# Patient Record
Sex: Female | Born: 1970 | Race: Black or African American | Hispanic: No | Marital: Single | Smoking: Current every day smoker
Health system: Southern US, Community
[De-identification: ages and names within clinical notes are randomized; demographics above are authoritative.]

## PROBLEM LIST (undated history)

## (undated) DIAGNOSIS — I219 Acute myocardial infarction, unspecified: Secondary | ICD-10-CM

## (undated) DIAGNOSIS — Z6841 Body Mass Index (BMI) 40.0 and over, adult: Secondary | ICD-10-CM

## (undated) DIAGNOSIS — I509 Heart failure, unspecified: Secondary | ICD-10-CM

## (undated) DIAGNOSIS — I2510 Atherosclerotic heart disease of native coronary artery without angina pectoris: Secondary | ICD-10-CM

## (undated) DIAGNOSIS — I10 Essential (primary) hypertension: Secondary | ICD-10-CM

## (undated) DIAGNOSIS — F329 Major depressive disorder, single episode, unspecified: Secondary | ICD-10-CM

## (undated) DIAGNOSIS — F32A Depression, unspecified: Secondary | ICD-10-CM

## (undated) HISTORY — PX: TUBAL LIGATION: SHX77

## (undated) HISTORY — DX: Morbid (severe) obesity due to excess calories: E66.01

## (undated) HISTORY — DX: Body Mass Index (BMI) 40.0 and over, adult: Z684

---

## 1997-07-12 ENCOUNTER — Ambulatory Visit (HOSPITAL_COMMUNITY): Admission: RE | Admit: 1997-07-12 | Discharge: 1997-07-12 | Payer: Self-pay | Admitting: Obstetrics

## 1997-10-01 ENCOUNTER — Ambulatory Visit (HOSPITAL_COMMUNITY): Admission: RE | Admit: 1997-10-01 | Discharge: 1997-10-01 | Payer: Self-pay | Admitting: Obstetrics

## 1997-10-05 ENCOUNTER — Ambulatory Visit (HOSPITAL_COMMUNITY): Admission: RE | Admit: 1997-10-05 | Discharge: 1997-10-05 | Payer: Self-pay | Admitting: Obstetrics

## 1997-12-06 ENCOUNTER — Inpatient Hospital Stay (HOSPITAL_COMMUNITY): Admission: AD | Admit: 1997-12-06 | Discharge: 1997-12-09 | Payer: Self-pay | Admitting: Obstetrics

## 1998-07-31 ENCOUNTER — Emergency Department (HOSPITAL_COMMUNITY): Admission: EM | Admit: 1998-07-31 | Discharge: 1998-07-31 | Payer: Self-pay | Admitting: Emergency Medicine

## 1999-05-14 ENCOUNTER — Emergency Department (HOSPITAL_COMMUNITY): Admission: EM | Admit: 1999-05-14 | Discharge: 1999-05-14 | Payer: Self-pay | Admitting: Emergency Medicine

## 1999-05-27 ENCOUNTER — Other Ambulatory Visit: Admission: RE | Admit: 1999-05-27 | Discharge: 1999-05-27 | Payer: Self-pay | Admitting: Obstetrics

## 1999-06-05 ENCOUNTER — Encounter: Payer: Self-pay | Admitting: Obstetrics

## 1999-06-05 ENCOUNTER — Ambulatory Visit (HOSPITAL_COMMUNITY): Admission: RE | Admit: 1999-06-05 | Discharge: 1999-06-05 | Payer: Self-pay | Admitting: Obstetrics

## 2000-08-18 ENCOUNTER — Emergency Department (HOSPITAL_COMMUNITY): Admission: EM | Admit: 2000-08-18 | Discharge: 2000-08-18 | Payer: Self-pay | Admitting: Internal Medicine

## 2000-10-03 ENCOUNTER — Emergency Department (HOSPITAL_COMMUNITY): Admission: EM | Admit: 2000-10-03 | Discharge: 2000-10-03 | Payer: Self-pay | Admitting: Emergency Medicine

## 2000-10-29 ENCOUNTER — Emergency Department (HOSPITAL_COMMUNITY): Admission: EM | Admit: 2000-10-29 | Discharge: 2000-10-29 | Payer: Self-pay | Admitting: Emergency Medicine

## 2001-03-25 ENCOUNTER — Emergency Department (HOSPITAL_COMMUNITY): Admission: EM | Admit: 2001-03-25 | Discharge: 2001-03-25 | Payer: Self-pay | Admitting: Emergency Medicine

## 2001-08-08 ENCOUNTER — Emergency Department (HOSPITAL_COMMUNITY): Admission: EM | Admit: 2001-08-08 | Discharge: 2001-08-08 | Payer: Self-pay | Admitting: *Deleted

## 2001-08-13 ENCOUNTER — Emergency Department (HOSPITAL_COMMUNITY): Admission: EM | Admit: 2001-08-13 | Discharge: 2001-08-13 | Payer: Self-pay | Admitting: Emergency Medicine

## 2001-08-13 ENCOUNTER — Encounter: Payer: Self-pay | Admitting: Emergency Medicine

## 2002-05-22 ENCOUNTER — Encounter: Payer: Self-pay | Admitting: Emergency Medicine

## 2002-05-22 ENCOUNTER — Emergency Department (HOSPITAL_COMMUNITY): Admission: EM | Admit: 2002-05-22 | Discharge: 2002-05-22 | Payer: Self-pay | Admitting: Emergency Medicine

## 2002-05-23 ENCOUNTER — Encounter: Admission: RE | Admit: 2002-05-23 | Discharge: 2002-08-21 | Payer: Self-pay | Admitting: Sports Medicine

## 2002-06-14 ENCOUNTER — Encounter: Payer: Self-pay | Admitting: Emergency Medicine

## 2002-06-15 ENCOUNTER — Inpatient Hospital Stay (HOSPITAL_COMMUNITY): Admission: EM | Admit: 2002-06-15 | Discharge: 2002-06-17 | Payer: Self-pay | Admitting: Emergency Medicine

## 2002-06-17 ENCOUNTER — Encounter: Payer: Self-pay | Admitting: *Deleted

## 2003-08-02 ENCOUNTER — Emergency Department (HOSPITAL_COMMUNITY): Admission: EM | Admit: 2003-08-02 | Discharge: 2003-08-02 | Payer: Self-pay | Admitting: Emergency Medicine

## 2004-09-26 ENCOUNTER — Emergency Department (HOSPITAL_COMMUNITY): Admission: EM | Admit: 2004-09-26 | Discharge: 2004-09-26 | Payer: Self-pay | Admitting: Emergency Medicine

## 2005-01-06 ENCOUNTER — Emergency Department (HOSPITAL_COMMUNITY): Admission: EM | Admit: 2005-01-06 | Discharge: 2005-01-06 | Payer: Self-pay | Admitting: Emergency Medicine

## 2005-01-21 ENCOUNTER — Emergency Department (HOSPITAL_COMMUNITY): Admission: EM | Admit: 2005-01-21 | Discharge: 2005-01-21 | Payer: Self-pay | Admitting: Emergency Medicine

## 2005-11-23 ENCOUNTER — Emergency Department (HOSPITAL_COMMUNITY): Admission: EM | Admit: 2005-11-23 | Discharge: 2005-11-23 | Payer: Self-pay | Admitting: Emergency Medicine

## 2005-12-18 ENCOUNTER — Emergency Department (HOSPITAL_COMMUNITY): Admission: EM | Admit: 2005-12-18 | Discharge: 2005-12-18 | Payer: Self-pay | Admitting: Family Medicine

## 2006-03-14 ENCOUNTER — Emergency Department (HOSPITAL_COMMUNITY): Admission: EM | Admit: 2006-03-14 | Discharge: 2006-03-15 | Payer: Self-pay | Admitting: Emergency Medicine

## 2006-06-20 ENCOUNTER — Inpatient Hospital Stay (HOSPITAL_COMMUNITY): Admission: EM | Admit: 2006-06-20 | Discharge: 2006-06-21 | Payer: Self-pay | Admitting: Emergency Medicine

## 2006-10-20 ENCOUNTER — Emergency Department (HOSPITAL_COMMUNITY): Admission: EM | Admit: 2006-10-20 | Discharge: 2006-10-20 | Payer: Self-pay | Admitting: Emergency Medicine

## 2006-10-22 ENCOUNTER — Encounter: Admission: RE | Admit: 2006-10-22 | Discharge: 2006-10-22 | Payer: Self-pay | Admitting: General Surgery

## 2006-11-11 ENCOUNTER — Encounter: Admission: RE | Admit: 2006-11-11 | Discharge: 2006-11-11 | Payer: Self-pay | Admitting: General Surgery

## 2006-11-16 ENCOUNTER — Ambulatory Visit (HOSPITAL_COMMUNITY): Admission: RE | Admit: 2006-11-16 | Discharge: 2006-11-16 | Payer: Self-pay | Admitting: General Surgery

## 2006-11-25 ENCOUNTER — Emergency Department (HOSPITAL_COMMUNITY): Admission: EM | Admit: 2006-11-25 | Discharge: 2006-11-25 | Payer: Self-pay | Admitting: Family Medicine

## 2007-05-06 ENCOUNTER — Emergency Department (HOSPITAL_COMMUNITY): Admission: EM | Admit: 2007-05-06 | Discharge: 2007-05-06 | Payer: Self-pay | Admitting: Emergency Medicine

## 2007-05-24 ENCOUNTER — Emergency Department (HOSPITAL_COMMUNITY): Admission: EM | Admit: 2007-05-24 | Discharge: 2007-05-24 | Payer: Self-pay | Admitting: Emergency Medicine

## 2007-06-08 ENCOUNTER — Inpatient Hospital Stay (HOSPITAL_COMMUNITY): Admission: AD | Admit: 2007-06-08 | Discharge: 2007-06-10 | Payer: Self-pay | Admitting: Cardiovascular Disease

## 2007-06-09 ENCOUNTER — Encounter (INDEPENDENT_AMBULATORY_CARE_PROVIDER_SITE_OTHER): Payer: Self-pay | Admitting: Cardiovascular Disease

## 2007-08-29 ENCOUNTER — Emergency Department (HOSPITAL_COMMUNITY): Admission: EM | Admit: 2007-08-29 | Discharge: 2007-08-29 | Payer: Self-pay | Admitting: Emergency Medicine

## 2007-11-01 ENCOUNTER — Emergency Department (HOSPITAL_COMMUNITY): Admission: EM | Admit: 2007-11-01 | Discharge: 2007-11-01 | Payer: Self-pay | Admitting: Emergency Medicine

## 2007-12-25 ENCOUNTER — Inpatient Hospital Stay (HOSPITAL_COMMUNITY): Admission: EM | Admit: 2007-12-25 | Discharge: 2007-12-26 | Payer: Self-pay | Admitting: Emergency Medicine

## 2007-12-26 ENCOUNTER — Encounter (INDEPENDENT_AMBULATORY_CARE_PROVIDER_SITE_OTHER): Payer: Self-pay | Admitting: Cardiovascular Disease

## 2008-05-24 ENCOUNTER — Encounter: Admission: RE | Admit: 2008-05-24 | Discharge: 2008-05-24 | Payer: Self-pay | Admitting: Nephrology

## 2008-06-13 ENCOUNTER — Encounter (HOSPITAL_COMMUNITY): Admission: RE | Admit: 2008-06-13 | Discharge: 2008-09-11 | Payer: Self-pay | Admitting: Cardiology

## 2008-06-13 ENCOUNTER — Emergency Department (HOSPITAL_COMMUNITY): Admission: EM | Admit: 2008-06-13 | Discharge: 2008-06-13 | Payer: Self-pay | Admitting: Emergency Medicine

## 2008-08-02 ENCOUNTER — Emergency Department (HOSPITAL_COMMUNITY): Admission: EM | Admit: 2008-08-02 | Discharge: 2008-08-02 | Payer: Self-pay | Admitting: Emergency Medicine

## 2008-09-04 ENCOUNTER — Emergency Department (HOSPITAL_COMMUNITY): Admission: EM | Admit: 2008-09-04 | Discharge: 2008-09-04 | Payer: Self-pay | Admitting: Emergency Medicine

## 2008-09-15 ENCOUNTER — Inpatient Hospital Stay (HOSPITAL_COMMUNITY): Admission: EM | Admit: 2008-09-15 | Discharge: 2008-09-18 | Payer: Self-pay | Admitting: Emergency Medicine

## 2008-10-16 ENCOUNTER — Ambulatory Visit (HOSPITAL_BASED_OUTPATIENT_CLINIC_OR_DEPARTMENT_OTHER): Admission: RE | Admit: 2008-10-16 | Discharge: 2008-10-16 | Payer: Self-pay | Admitting: Nephrology

## 2008-10-20 ENCOUNTER — Ambulatory Visit: Payer: Self-pay | Admitting: Internal Medicine

## 2008-11-30 ENCOUNTER — Emergency Department (HOSPITAL_COMMUNITY): Admission: EM | Admit: 2008-11-30 | Discharge: 2008-11-30 | Payer: Self-pay | Admitting: Emergency Medicine

## 2009-01-28 ENCOUNTER — Encounter: Payer: Self-pay | Admitting: Internal Medicine

## 2009-02-20 ENCOUNTER — Encounter: Admission: RE | Admit: 2009-02-20 | Discharge: 2009-02-20 | Payer: Self-pay | Admitting: Obstetrics

## 2009-02-26 ENCOUNTER — Ambulatory Visit (HOSPITAL_COMMUNITY): Admission: RE | Admit: 2009-02-26 | Discharge: 2009-02-26 | Payer: Self-pay | Admitting: Obstetrics

## 2009-05-26 ENCOUNTER — Inpatient Hospital Stay (HOSPITAL_COMMUNITY): Admission: EM | Admit: 2009-05-26 | Discharge: 2009-05-30 | Payer: Self-pay | Admitting: Emergency Medicine

## 2009-05-28 ENCOUNTER — Encounter (INDEPENDENT_AMBULATORY_CARE_PROVIDER_SITE_OTHER): Payer: Self-pay | Admitting: Cardiovascular Disease

## 2009-08-03 ENCOUNTER — Emergency Department (HOSPITAL_COMMUNITY): Admission: EM | Admit: 2009-08-03 | Discharge: 2009-08-03 | Payer: Self-pay | Admitting: Family Medicine

## 2009-09-03 ENCOUNTER — Emergency Department (HOSPITAL_COMMUNITY): Admission: EM | Admit: 2009-09-03 | Discharge: 2009-09-03 | Payer: Self-pay | Admitting: Emergency Medicine

## 2009-09-18 ENCOUNTER — Emergency Department (HOSPITAL_COMMUNITY): Admission: EM | Admit: 2009-09-18 | Discharge: 2009-09-19 | Payer: Self-pay | Admitting: Emergency Medicine

## 2009-11-24 ENCOUNTER — Emergency Department (HOSPITAL_COMMUNITY): Admission: EM | Admit: 2009-11-24 | Discharge: 2009-11-24 | Payer: Self-pay | Admitting: Emergency Medicine

## 2010-02-13 ENCOUNTER — Emergency Department (HOSPITAL_COMMUNITY): Admission: EM | Admit: 2010-02-13 | Discharge: 2009-12-04 | Payer: Self-pay | Admitting: Emergency Medicine

## 2010-02-27 ENCOUNTER — Emergency Department (HOSPITAL_COMMUNITY)
Admission: EM | Admit: 2010-02-27 | Discharge: 2010-02-28 | Payer: Self-pay | Source: Home / Self Care | Admitting: Emergency Medicine

## 2010-03-12 ENCOUNTER — Emergency Department (HOSPITAL_COMMUNITY)
Admission: EM | Admit: 2010-03-12 | Discharge: 2010-03-12 | Payer: Self-pay | Source: Home / Self Care | Admitting: Family Medicine

## 2010-03-30 ENCOUNTER — Encounter: Payer: Self-pay | Admitting: Emergency Medicine

## 2010-03-30 ENCOUNTER — Encounter (HOSPITAL_BASED_OUTPATIENT_CLINIC_OR_DEPARTMENT_OTHER): Payer: Self-pay | Admitting: General Surgery

## 2010-03-30 ENCOUNTER — Encounter: Payer: Self-pay | Admitting: General Surgery

## 2010-03-30 ENCOUNTER — Encounter: Payer: Self-pay | Admitting: Cardiology

## 2010-03-30 ENCOUNTER — Encounter: Payer: Self-pay | Admitting: Obstetrics

## 2010-03-31 ENCOUNTER — Encounter: Payer: Self-pay | Admitting: Cardiology

## 2010-05-19 LAB — RAPID STREP SCREEN (MED CTR MEBANE ONLY): Streptococcus, Group A Screen (Direct): NEGATIVE

## 2010-05-22 LAB — RAPID STREP SCREEN (MED CTR MEBANE ONLY): Streptococcus, Group A Screen (Direct): NEGATIVE

## 2010-05-22 LAB — CBC
Platelets: 362 10*3/uL (ref 150–400)
RDW: 17.4 % — ABNORMAL HIGH (ref 11.5–15.5)
WBC: 10 10*3/uL (ref 4.0–10.5)

## 2010-05-22 LAB — DIFFERENTIAL
Basophils Absolute: 0.1 10*3/uL (ref 0.0–0.1)
Lymphocytes Relative: 33 % (ref 12–46)
Neutro Abs: 5.9 10*3/uL (ref 1.7–7.7)
Neutrophils Relative %: 59 % (ref 43–77)

## 2010-05-22 LAB — MONONUCLEOSIS SCREEN: Mono Screen: NEGATIVE

## 2010-05-22 LAB — POCT I-STAT, CHEM 8
Calcium, Ion: 1.18 mmol/L (ref 1.12–1.32)
Chloride: 110 mEq/L (ref 96–112)
HCT: 37 % (ref 36.0–46.0)
Potassium: 3.2 mEq/L — ABNORMAL LOW (ref 3.5–5.1)

## 2010-05-25 LAB — DIFFERENTIAL
Eosinophils Absolute: 0.1 10*3/uL (ref 0.0–0.7)
Eosinophils Relative: 1 % (ref 0–5)
Lymphocytes Relative: 14 % (ref 12–46)
Lymphs Abs: 1.6 10*3/uL (ref 0.7–4.0)
Monocytes Relative: 3 % (ref 3–12)
Neutro Abs: 9.4 10*3/uL — ABNORMAL HIGH (ref 1.7–7.7)
Neutrophils Relative %: 82 % — ABNORMAL HIGH (ref 43–77)

## 2010-05-25 LAB — CBC
HCT: 34.8 % — ABNORMAL LOW (ref 36.0–46.0)
Hemoglobin: 11.5 g/dL — ABNORMAL LOW (ref 12.0–15.0)
MCHC: 33.1 g/dL (ref 30.0–36.0)

## 2010-05-25 LAB — POCT CARDIAC MARKERS
CKMB, poc: 2.6 ng/mL (ref 1.0–8.0)
Troponin i, poc: 0.08 ng/mL (ref 0.00–0.09)

## 2010-05-25 LAB — ETHANOL: Alcohol, Ethyl (B): <5 mg/dL (ref 0–10)

## 2010-05-25 LAB — COMPREHENSIVE METABOLIC PANEL
Alkaline Phosphatase: 97 U/L (ref 39–117)
BUN: 12 mg/dL (ref 6–23)
Chloride: 106 mEq/L (ref 96–112)
Glucose, Bld: 102 mg/dL — ABNORMAL HIGH (ref 70–99)
Potassium: 3.3 mEq/L — ABNORMAL LOW (ref 3.5–5.1)
Total Bilirubin: 0.5 mg/dL (ref 0.3–1.2)

## 2010-05-25 LAB — BRAIN NATRIURETIC PEPTIDE: Pro B Natriuretic peptide (BNP): 541 pg/mL — ABNORMAL HIGH (ref 0.0–100.0)

## 2010-05-30 ENCOUNTER — Emergency Department (HOSPITAL_COMMUNITY): Payer: Medicaid Other

## 2010-05-30 ENCOUNTER — Observation Stay (HOSPITAL_COMMUNITY)
Admission: EM | Admit: 2010-05-30 | Discharge: 2010-06-02 | Disposition: A | Discharge status: Home / Self Care | Payer: Medicaid Other | Source: Ambulatory Visit | Attending: Cardiovascular Disease | Admitting: Cardiovascular Disease

## 2010-05-30 ENCOUNTER — Observation Stay (HOSPITAL_COMMUNITY): Payer: Medicaid Other

## 2010-05-30 ENCOUNTER — Other Ambulatory Visit: Payer: Self-pay | Admitting: Cardiovascular Disease

## 2010-05-30 DIAGNOSIS — R079 Chest pain, unspecified: Principal | ICD-10-CM | POA: Insufficient documentation

## 2010-05-30 DIAGNOSIS — D509 Iron deficiency anemia, unspecified: Secondary | ICD-10-CM | POA: Insufficient documentation

## 2010-05-30 DIAGNOSIS — Z7982 Long term (current) use of aspirin: Secondary | ICD-10-CM | POA: Insufficient documentation

## 2010-05-30 DIAGNOSIS — R42 Dizziness and giddiness: Secondary | ICD-10-CM | POA: Insufficient documentation

## 2010-05-30 DIAGNOSIS — Z79899 Other long term (current) drug therapy: Secondary | ICD-10-CM | POA: Insufficient documentation

## 2010-05-30 DIAGNOSIS — I252 Old myocardial infarction: Secondary | ICD-10-CM | POA: Insufficient documentation

## 2010-05-30 DIAGNOSIS — K219 Gastro-esophageal reflux disease without esophagitis: Secondary | ICD-10-CM | POA: Insufficient documentation

## 2010-05-30 DIAGNOSIS — R0602 Shortness of breath: Secondary | ICD-10-CM | POA: Insufficient documentation

## 2010-05-30 DIAGNOSIS — I10 Essential (primary) hypertension: Secondary | ICD-10-CM | POA: Insufficient documentation

## 2010-05-30 DIAGNOSIS — F17200 Nicotine dependence, unspecified, uncomplicated: Secondary | ICD-10-CM | POA: Insufficient documentation

## 2010-05-30 DIAGNOSIS — J449 Chronic obstructive pulmonary disease, unspecified: Secondary | ICD-10-CM | POA: Insufficient documentation

## 2010-05-30 DIAGNOSIS — E785 Hyperlipidemia, unspecified: Secondary | ICD-10-CM | POA: Insufficient documentation

## 2010-05-30 DIAGNOSIS — J4489 Other specified chronic obstructive pulmonary disease: Secondary | ICD-10-CM | POA: Insufficient documentation

## 2010-05-30 DIAGNOSIS — I422 Other hypertrophic cardiomyopathy: Secondary | ICD-10-CM | POA: Insufficient documentation

## 2010-05-30 LAB — CBC
HCT: 31.8 % — ABNORMAL LOW (ref 36.0–46.0)
MCH: 26.7 pg (ref 26.0–34.0)
MCV: 80.9 fL (ref 78.0–100.0)
RBC: 3.93 MIL/uL (ref 3.87–5.11)
WBC: 7.4 10*3/uL (ref 4.0–10.5)

## 2010-05-30 LAB — DIFFERENTIAL
Eosinophils Absolute: 0.1 10*3/uL (ref 0.0–0.7)
Eosinophils Relative: 2 % (ref 0–5)
Lymphocytes Relative: 40 % (ref 12–46)
Lymphs Abs: 3 10*3/uL (ref 0.7–4.0)
Monocytes Relative: 7 % (ref 3–12)
Neutrophils Relative %: 52 % (ref 43–77)

## 2010-05-30 LAB — BASIC METABOLIC PANEL
BUN: 11 mg/dL (ref 6–23)
CO2: 24 mEq/L (ref 19–32)
Calcium: 8.9 mg/dL (ref 8.4–10.5)
Chloride: 106 mEq/L (ref 96–112)
Creatinine, Ser: 1.4 mg/dL — ABNORMAL HIGH (ref 0.4–1.2)
Creatinine, Ser: 1.04 mg/dL (ref 0.4–1.2)
GFR calc Af Amer: >60 mL/min (ref >60)
GFR calc non Af Amer: 58 mL/min — ABNORMAL LOW (ref >60)
Glucose, Bld: 135 mg/dL — ABNORMAL HIGH (ref 70–99)
Potassium: 2.8 mEq/L — ABNORMAL LOW (ref 3.5–5.1)

## 2010-05-30 LAB — PROTIME-INR
INR: 1.08 (ref 0.00–1.49)
Prothrombin Time: 14.2 seconds (ref 11.6–15.2)

## 2010-05-30 LAB — URINALYSIS, ROUTINE W REFLEX MICROSCOPIC
Glucose, UA: NEGATIVE mg/dL
Nitrite: NEGATIVE
Specific Gravity, Urine: 1.019 (ref 1.005–1.030)
pH: 6.5 (ref 5.0–8.0)

## 2010-05-30 LAB — URINE MICROSCOPIC-ADD ON

## 2010-05-30 LAB — TROPONIN I: Troponin I: 0.25 ng/mL — ABNORMAL HIGH (ref 0.00–0.06)

## 2010-05-30 LAB — CARDIAC PANEL(CRET KIN+CKTOT+MB+TROPI)
Relative Index: 2.1 (ref 0.0–2.5)
Total CK: 214 U/L — ABNORMAL HIGH (ref 7–177)
Troponin I: 0.21 ng/mL — ABNORMAL HIGH (ref 0.00–0.06)
Troponin I: 0.2 ng/mL — ABNORMAL HIGH (ref 0.00–0.06)

## 2010-05-30 LAB — RAPID URINE DRUG SCREEN, HOSP PERFORMED
Cocaine: NOT DETECTED
Tetrahydrocannabinol: POSITIVE — AB

## 2010-05-30 LAB — GLUCOSE, CAPILLARY: Glucose-Capillary: 97 mg/dL (ref 70–99)

## 2010-05-30 LAB — APTT: aPTT: 32 seconds (ref 24–37)

## 2010-05-31 ENCOUNTER — Other Ambulatory Visit (HOSPITAL_COMMUNITY): Payer: Medicaid Other

## 2010-05-31 ENCOUNTER — Observation Stay (HOSPITAL_COMMUNITY): Payer: Medicaid Other

## 2010-05-31 LAB — URINE CULTURE
Culture  Setup Time: 201203230924
Culture: NO GROWTH

## 2010-05-31 LAB — CARDIAC PANEL(CRET KIN+CKTOT+MB+TROPI)
CK, MB: 3.4 ng/mL (ref 0.3–4.0)
Relative Index: 1.5 (ref 0.0–2.5)
Total CK: 189 U/L — ABNORMAL HIGH (ref 7–177)
Troponin I: 0.17 ng/mL — ABNORMAL HIGH (ref 0.00–0.06)

## 2010-05-31 LAB — CBC
HCT: 30.5 % — ABNORMAL LOW (ref 36.0–46.0)
Hemoglobin: 9.9 g/dL — ABNORMAL LOW (ref 12.0–15.0)
WBC: 7.2 10*3/uL (ref 4.0–10.5)

## 2010-05-31 LAB — SEDIMENTATION RATE: Sed Rate: 23 mm/hr — ABNORMAL HIGH (ref 0–22)

## 2010-05-31 LAB — HEPARIN LEVEL (UNFRACTIONATED): Heparin Unfractionated: 0.18 IU/mL — ABNORMAL LOW (ref 0.30–0.70)

## 2010-05-31 MED ORDER — TECHNETIUM TC 99M TETROFOSMIN IV KIT
30.0000 | PACK | Freq: Once | INTRAVENOUS | Status: AC | PRN
  Administered 2010-05-31: 30 via INTRAVENOUS

## 2010-05-31 MED ORDER — TECHNETIUM TC 99M TETROFOSMIN IV KIT
30.0000 | PACK | Freq: Once | INTRAVENOUS | Status: AC | PRN
  Administered 2010-05-30: 30 via INTRAVENOUS

## 2010-05-31 MED ORDER — TECHNETIUM TC 99M TETROFOSMIN IV KIT: 30.0000 | PACK | Freq: Once | INTRAVENOUS | Status: AC | PRN

## 2010-06-01 ENCOUNTER — Other Ambulatory Visit (HOSPITAL_COMMUNITY): Payer: Medicaid Other

## 2010-06-01 LAB — CBC
HCT: 31.1 % — ABNORMAL LOW (ref 36.0–46.0)
Hemoglobin: 10.1 g/dL — ABNORMAL LOW (ref 12.0–15.0)
MCHC: 32.5 g/dL (ref 30.0–36.0)
RBC: 3.73 MIL/uL — ABNORMAL LOW (ref 3.87–5.11)
WBC: 7.3 10*3/uL (ref 4.0–10.5)

## 2010-06-01 LAB — CARDIAC PANEL(CRET KIN+CKTOT+MB+TROPI)
CK, MB: 2.8 ng/mL (ref 0.3–4.0)
Relative Index: 1.6 (ref 0.0–2.5)
Total CK: 172 U/L (ref 7–177)
Troponin I: 0.14 ng/mL — ABNORMAL HIGH (ref 0.00–0.06)

## 2010-06-01 LAB — HEPARIN LEVEL (UNFRACTIONATED): Heparin Unfractionated: 0.73 IU/mL — ABNORMAL HIGH (ref 0.30–0.70)

## 2010-06-02 LAB — CBC
HCT: 30.8 % — ABNORMAL LOW (ref 36.0–46.0)
HCT: 29.5 % — ABNORMAL LOW (ref 36.0–46.0)
HCT: 29.9 % — ABNORMAL LOW (ref 36.0–46.0)
Hemoglobin: 10.1 g/dL — ABNORMAL LOW (ref 12.0–15.0)
Hemoglobin: 9.4 g/dL — ABNORMAL LOW (ref 12.0–15.0)
Hemoglobin: 9.7 g/dL — ABNORMAL LOW (ref 12.0–15.0)
MCHC: 31.5 g/dL (ref 30.0–36.0)
MCHC: 33.1 g/dL (ref 30.0–36.0)
MCV: 80.9 fL (ref 78.0–100.0)
Platelets: 251 10*3/uL (ref 150–400)
Platelets: 319 10*3/uL (ref 150–400)
Platelets: 320 10*3/uL (ref 150–400)
Platelets: 322 10*3/uL (ref 150–400)
RBC: 3.64 MIL/uL — ABNORMAL LOW (ref 3.87–5.11)
RBC: 3.7 MIL/uL — ABNORMAL LOW (ref 3.87–5.11)
RBC: 3.82 MIL/uL — ABNORMAL LOW (ref 3.87–5.11)
RDW: 16.5 % — ABNORMAL HIGH (ref 11.5–15.5)
RDW: 17.5 % — ABNORMAL HIGH (ref 11.5–15.5)
WBC: 8.6 10*3/uL (ref 4.0–10.5)
WBC: 10.5 10*3/uL (ref 4.0–10.5)
WBC: 13.1 10*3/uL — ABNORMAL HIGH (ref 4.0–10.5)
WBC: 19.8 10*3/uL — ABNORMAL HIGH (ref 4.0–10.5)

## 2010-06-02 LAB — COMPREHENSIVE METABOLIC PANEL
AST: 21 U/L (ref 0–37)
Albumin: 3.2 g/dL — ABNORMAL LOW (ref 3.5–5.2)
BUN: 9 mg/dL (ref 6–23)
Calcium: 8.5 mg/dL (ref 8.4–10.5)
Chloride: 112 mEq/L (ref 96–112)
Creatinine, Ser: 0.74 mg/dL (ref 0.4–1.2)
GFR calc Af Amer: >60 mL/min (ref >60)
Total Bilirubin: 0.3 mg/dL (ref 0.3–1.2)

## 2010-06-02 LAB — CARDIAC PANEL(CRET KIN+CKTOT+MB+TROPI)
CK, MB: 4.8 ng/mL — ABNORMAL HIGH (ref 0.3–4.0)
Relative Index: 3.4 — ABNORMAL HIGH (ref 0.0–2.5)
Total CK: 183 U/L — ABNORMAL HIGH (ref 7–177)
Total CK: 192 U/L — ABNORMAL HIGH (ref 7–177)
Troponin I: 0.14 ng/mL — ABNORMAL HIGH (ref 0.00–0.06)

## 2010-06-02 LAB — LIPID PANEL
Cholesterol: 146 mg/dL (ref 0–200)
LDL Cholesterol: 49 mg/dL (ref 0–99)
Triglycerides: 34 mg/dL (ref <150)
VLDL: 32 mg/dL (ref 0–40)

## 2010-06-02 LAB — BASIC METABOLIC PANEL
BUN: 9 mg/dL (ref 6–23)
Chloride: 107 mEq/L (ref 96–112)
GFR calc non Af Amer: >60 mL/min (ref >60)
Potassium: 3.5 mEq/L (ref 3.5–5.1)
Potassium: 4.2 mEq/L (ref 3.5–5.1)
Sodium: 139 mEq/L (ref 135–145)

## 2010-06-02 LAB — TROPONIN I: Troponin I: 0.17 ng/mL — ABNORMAL HIGH (ref 0.00–0.06)

## 2010-06-02 LAB — IRON AND TIBC
Iron: 31 ug/dL — ABNORMAL LOW (ref 42–135)
TIBC: 331 ug/dL (ref 250–470)
UIBC: 300 ug/dL

## 2010-06-02 LAB — HEPARIN LEVEL (UNFRACTIONATED)
Heparin Unfractionated: 0.43 IU/mL (ref 0.30–0.70)
Heparin Unfractionated: 0.44 IU/mL (ref 0.30–0.70)
Heparin Unfractionated: 0.54 IU/mL (ref 0.30–0.70)
Heparin Unfractionated: 0.55 IU/mL (ref 0.30–0.70)

## 2010-06-02 LAB — TSH: TSH: 0.246 u[IU]/mL — ABNORMAL LOW (ref 0.350–4.500)

## 2010-06-02 LAB — FERRITIN: Ferritin: 21 ng/mL (ref 10–291)

## 2010-06-02 LAB — CK TOTAL AND CKMB (NOT AT ARMC)
CK, MB: 6.4 ng/mL (ref 0.3–4.0)
Relative Index: 2.9 — ABNORMAL HIGH (ref 0.0–2.5)

## 2010-06-02 LAB — BRAIN NATRIURETIC PEPTIDE: Pro B Natriuretic peptide (BNP): 368 pg/mL — ABNORMAL HIGH (ref 0.0–100.0)

## 2010-06-02 LAB — POCT CARDIAC MARKERS: Myoglobin, poc: 76.9 ng/mL (ref 12–200)

## 2010-06-13 NOTE — Discharge Summary (Signed)
Jamie, Farrell NO.:  [id number]  MEDICAL RECORD NO.:  [id number]           PATIENT TYPE:  I  LOCATION:  2029                         FACILITY:  [facility]  PHYSICIAN:  Ricki Rodriguez, M.D.  DATE OF BIRTH:  October 23, 1970  DATE OF ADMISSION:  05/30/2010 DATE OF DISCHARGE:  06/02/2010                              DISCHARGE SUMMARY   REFERRING PHYSICIAN:  Osvaldo Shipper. Spruill, MD  FINAL DIAGNOSES: 1. Chest pain. 2. Dizziness. 3. Hypertrophic cardiomyopathy. 4. Hypertension. 5. Obesity. 6. Hyperlipidemia. 7. Tobacco use disorder. 8. Asthma.  DISCHARGE MEDICATIONS: 1. Cymbalta 60 mg one tablet p.o. in the evening. 2. Seroquel 200 mg one tablet in the evening. 3. Remeron 30 mg one tablet daily. 4. Percocet 5/325 mg one tablet twice daily as needed. 5. Nitroglycerin 0.4 mg tablet once sublingual every 5 minutes x3 as     needed for chest pain. 6. Mucinex 600 mg one tablet every 12 hours as needed. 7. Aspirin 81 mg one daily. 8. Amlodipine 10 mg one daily.  DISCHARGE DIET:  Low-sodium heart-healthy diet.  DISCHARGE ACTIVITY: 1. The patient is to increase activity slowly as tolerated. 2. Follow up by Dr. Donia Guiles in 2 weeks.  HISTORY:  This 40 year old African American female with COPD, asthma, CAD status post MI in 2000 had substernal chest pain radiating to the jaw with a near syncope.  The patient was chest pain free after arrival to the emergency room.  PHYSICAL EXAMINATION:  VITAL SIGNS:  Temperature 97, pulse 77, respirations 20, blood pressure 105/41. GENERAL:  The patient is well-built, well-nourished female, in no acute distress. HEENT:  The patient is normocephalic, atraumatic, has brown eyes. Conjunctivae pink.  Sclerae nonicteric. NECK:  No JVD. LUNGS:  Clear bilaterally. HEART:  Normal S1 and S2. ABDOMEN:  Soft and distended but nontender. EXTREMITIES:  No edema, cyanosis, or clubbing. SKIN:  Warm and dry. NEUROLOGIC:  The patient  moves all her extremities.  She is alert and oriented x3.  LABORATORY DATA:  Slightly low hemoglobin of 10.5, hematocrit 31.8, normal WBC count and platelet count.  Normal electrolytes except potassium of 2.8, BUN 11, creatinine 1.4.  Subsequent potassium was 3.4, BUN 14, creatinine was 1.04.  CK-MB near normal x3.  Troponin I borderline at 0.25.  Lipid profile showed total cholesterol of 118, triglyceride of 159, LDL cholesterol of 49, and HDL cholesterol of 37. Iron was low at 31.  Urinalysis showed moderate leukocyte esterase, otherwise unremarkable.  Urine culture showed no growth.  Chest x-ray showed cardiomegaly without acute finding.   Nuclear stress test with a mild apical hypokinesia and ejection fraction  of 45% with anterior apical attenuation, probably due to breast soft  tissue attenuation; however, anterior ischemia cannot be completely  excluded.  EKG revealed normal sinus rhythm with left ventricular hypertrophy and nonspecific ST-T abnormalities.    Echocardiogram showed moderate concentric hypertrophy, vigorous systolic  function with small outflow tract gradient, and mildly dilated left  atrium.  HOSPITAL COURSE:  The patient was admitted to telemetry unit. Myocardial infarction was less likely by laboratory data.  She underwent  nuclear stress test that showed a possible anterior wall attenuation by  soft tissue of breast. The patient was given option to continue medical  therapy versus cardiac catheterization, and she chose to try medical  therapy for awhile along with treatment for iron-deficiency anemia. She  will be followed by me in about 1 month and by Dr. Donia Guiles in 2  weeks.     Ricki Rodriguez, M.D.     ASK/MEDQ  D:  06/11/2010  T:  06/11/2010  Job:  [id number]  Electronically Signed by Orpah Cobb M.D. on 06/13/2010 05:06:00 PM

## 2010-06-16 LAB — BASIC METABOLIC PANEL
BUN: 22 mg/dL (ref 6–23)
CO2: 25 mEq/L (ref 19–32)
Glucose, Bld: 113 mg/dL — ABNORMAL HIGH (ref 70–99)
Potassium: 4.3 mEq/L (ref 3.5–5.1)
Sodium: 137 mEq/L (ref 135–145)

## 2010-06-16 LAB — POCT CARDIAC MARKERS
CKMB, poc: 2.5 ng/mL (ref 1.0–8.0)
Myoglobin, poc: 66.6 ng/mL (ref 12–200)
Troponin i, poc: 0.14 ng/mL — ABNORMAL HIGH (ref 0.00–0.09)
Troponin i, poc: 0.17 ng/mL — ABNORMAL HIGH (ref 0.00–0.09)

## 2010-06-16 LAB — RENAL FUNCTION PANEL
Albumin: 3.3 g/dL — ABNORMAL LOW (ref 3.5–5.2)
BUN: 22 mg/dL (ref 6–23)
GFR calc Af Amer: >60 mL/min (ref >60)
GFR calc non Af Amer: >60 mL/min (ref >60)
Phosphorus: 4.8 mg/dL — ABNORMAL HIGH (ref 2.3–4.6)
Potassium: 4 mEq/L (ref 3.5–5.1)
Sodium: 136 mEq/L (ref 135–145)

## 2010-06-16 LAB — CBC
HCT: 32 % — ABNORMAL LOW (ref 36.0–46.0)
HCT: 32.9 % — ABNORMAL LOW (ref 36.0–46.0)
Hemoglobin: 10.7 g/dL — ABNORMAL LOW (ref 12.0–15.0)
Hemoglobin: 11.3 g/dL — ABNORMAL LOW (ref 12.0–15.0)
MCHC: 33.4 g/dL (ref 30.0–36.0)
MCHC: 33.9 g/dL (ref 30.0–36.0)
MCHC: 34.4 g/dL (ref 30.0–36.0)
MCV: 86.4 fL (ref 78.0–100.0)
Platelets: 300 10*3/uL (ref 150–400)
Platelets: 315 10*3/uL (ref 150–400)
RDW: 16.9 % — ABNORMAL HIGH (ref 11.5–15.5)
RDW: 17 % — ABNORMAL HIGH (ref 11.5–15.5)
RDW: 17.3 % — ABNORMAL HIGH (ref 11.5–15.5)

## 2010-06-16 LAB — COMPREHENSIVE METABOLIC PANEL
Alkaline Phosphatase: 98 U/L (ref 39–117)
BUN: 15 mg/dL (ref 6–23)
Glucose, Bld: 117 mg/dL — ABNORMAL HIGH (ref 70–99)
Potassium: 4.2 mEq/L (ref 3.5–5.1)
Total Bilirubin: 0.5 mg/dL (ref 0.3–1.2)
Total Protein: 7.3 g/dL (ref 6.0–8.3)

## 2010-06-16 LAB — POCT I-STAT 3, ART BLOOD GAS (G3+)
pCO2 arterial: 49.6 mmHg — ABNORMAL HIGH (ref 35.0–45.0)
pO2, Arterial: 455 mmHg — ABNORMAL HIGH (ref 80.0–100.0)

## 2010-06-16 LAB — URINE CULTURE: Culture: NO GROWTH

## 2010-06-16 LAB — POCT I-STAT, CHEM 8
Chloride: 106 mEq/L (ref 96–112)
HCT: 35 % — ABNORMAL LOW (ref 36.0–46.0)
Potassium: 4.4 mEq/L (ref 3.5–5.1)
Sodium: 137 mEq/L (ref 135–145)

## 2010-06-16 LAB — URINALYSIS, ROUTINE W REFLEX MICROSCOPIC
Bilirubin Urine: NEGATIVE
Hgb urine dipstick: NEGATIVE
Ketones, ur: NEGATIVE mg/dL
Specific Gravity, Urine: 1.016 (ref 1.005–1.030)
Urobilinogen, UA: 1 mg/dL (ref 0.0–1.0)
pH: 7.5 (ref 5.0–8.0)

## 2010-06-16 LAB — RAPID STREP SCREEN (MED CTR MEBANE ONLY): Streptococcus, Group A Screen (Direct): POSITIVE — AB

## 2010-06-16 LAB — RAPID URINE DRUG SCREEN, HOSP PERFORMED
Cocaine: POSITIVE — AB
Opiates: NOT DETECTED

## 2010-06-16 LAB — DIFFERENTIAL
Basophils Absolute: 0.1 10*3/uL (ref 0.0–0.1)
Basophils Relative: 0 % (ref 0–1)
Lymphocytes Relative: 15 % (ref 12–46)
Monocytes Absolute: 1 10*3/uL (ref 0.1–1.0)
Neutro Abs: 12.5 10*3/uL — ABNORMAL HIGH (ref 1.7–7.7)
Neutrophils Relative %: 79 % — ABNORMAL HIGH (ref 43–77)

## 2010-06-16 LAB — URINE MICROSCOPIC-ADD ON

## 2010-06-16 LAB — TSH: TSH: 0.222 u[IU]/mL — ABNORMAL LOW (ref 0.350–4.500)

## 2010-06-18 LAB — POCT I-STAT, CHEM 8
BUN: 14 mg/dL (ref 6–23)
Chloride: 108 mEq/L (ref 96–112)
Creatinine, Ser: 0.8 mg/dL (ref 0.4–1.2)
Glucose, Bld: 109 mg/dL — ABNORMAL HIGH (ref 70–99)
HCT: 37 % (ref 36.0–46.0)
Potassium: 3.4 mEq/L — ABNORMAL LOW (ref 3.5–5.1)

## 2010-06-18 LAB — DIFFERENTIAL
Lymphs Abs: 2.6 10*3/uL (ref 0.7–4.0)
Monocytes Relative: 5 % (ref 3–12)
Neutro Abs: 5.5 10*3/uL (ref 1.7–7.7)
Neutrophils Relative %: 63 % (ref 43–77)

## 2010-06-18 LAB — CBC: Hemoglobin: 12.1 g/dL (ref 12.0–15.0)

## 2010-06-18 LAB — POCT CARDIAC MARKERS
CKMB, poc: 2 ng/mL (ref 1.0–8.0)
Troponin i, poc: 0.12 ng/mL — ABNORMAL HIGH (ref 0.00–0.09)

## 2010-07-22 NOTE — H&P (Signed)
[name], [name] NO.:  [id number]   MEDICAL RECORD NO.:  [id number]          PATIENT TYPE:  INP   LOCATION:  4739                         FACILITY:  [facility]   PHYSICIAN:  Fleet Contras, M.D.    DATE OF BIRTH:  [birth year]   DATE OF ADMISSION:  09/15/2008  DATE OF DISCHARGE:                              HISTORY & PHYSICAL   ADMITTING PHYSICIAN:  Fleet Contras, MD   PRESENTING COMPLAINTS:  Shortness of breath and cough.   HISTORY OF PRESENT ILLNESS:  [name] is a 40 year old African American  lady with past medical history significant for chronic obstructive  pulmonary disease and asthma, coronary artery disease status post MI in  2000, systemic hypertension, morbid obesity, and tobacco abuse.  She  came to the emergency room at [facility] with several weeks'  history of cough, productive of greenish sputum, associated with  progressive shortness of breath.  She denied having any chest pain,  fevers, chills, or palpitations.  She has no orthopnea or PND.  At the  emergency room, she was noted to have severe wheezing diffusely and she  received nebulized bronchodilators and intravenous steroids with some  improvement, but not significantly improved enough for her to be  discharged home.  She was therefore admitted to the hospital for further  evaluation and appropriate treatment.   PAST MEDICAL HISTORY:  1. Bronchial asthma/COPD.  2. History of tobacco abuse.  3. History of coronary artery disease status post myocardial      infarction in 2004.  Her last cardiac catheterization was in April      2008 and this showed normal coronaries with supranormal LV systolic      function, ejection fraction 75%, and elevated left ventricular end-      diastolic pressure suggestive of hypertensive heart disease.  Her      last 2-D echocardiogram was on December 26, 2007, and this revealed      overall left ventricular systolic function was vigorous, ejection  fraction 75%.  There was mild mitral valvular regurgitation, mildly      dilated left atrium, and moderate left ventricular hypertrophy.  4. Systemic hypertension.  5. Morbid obesity.  6. Tobacco abuse.  7. Depression.  8. Hyperlipidemia.  9. Chronic low back pain.  10.Gastroesophageal reflux disease.   MEDICATION HISTORY:  She is on:  1. Sulindac 200 mg p.o. b.i.d.  2. Omeprazole 40 mg daily.  3. Lexapro 20 mg daily.  4. Zocor 40 mg daily.  5. Cyclobenzaprine 10 mg t.i.d. p.r.n.  6. HCTZ/triamterene 25/37.5 mg p.o. daily.  7. Metoclopramide 10 mg p.o. t.i.d. at bedtime.  8. Gemfibrozil 600 mg p.o. b.i.d.  9. Ibuprofen 200 mg p.o. b.i.d.  10.Metoprolol tartrate 50 mg p.o. b.i.d.  11.Avapro 300 mg p.o. daily.  12.Abilify 5 mg p.o. daily.  13.Advair Diskus 250/50 one puff b.i.d.   ALLERGIES:  She has allergies to FLAGYL.   FAMILY AND SOCIAL HISTORY:  She is single and has 3 children, 55, 75,  and 10 years old.  Her mother is deceased from lung cancer.  Father died  from congestive heart failure at age 79.  She has 3 siblings, 1 with  hypertension.  She smokes about half pack of cigarettes per day and  drinks alcohol socially.  Denies any drug abuse.  She is unemployed and  disabled.   REVIEW OF SYSTEMS:  GENERAL:  She denies having any fevers or chills,  but feels generally unwell, weak, and fatigued.  CVS:  As above.  RESPIRATORY:  As above.  GI:  She denies any nausea, vomiting, diarrhea,  or constipation.  MUSCULOSKELETAL:  She has chronic low back pain as  well as knee pain which worsens when she tries to exercise.  She denies  any recent injuries.  GU:  She has no dysuria, frequency, or hematuria.   PHYSICAL EXAMINATION:  GENERAL:  She is lying comfortably in the  hospital bed, not in any acute or painful distress.  She does have some  mild respiratory distress.  She is not pale.  She is not icteric.  She  is not cyanosed.  She is well hydrated.  VITAL SIGNS:  Her  vital signs at the emergency room show a blood  pressure of 145/98, heart rate of 90, respiratory rate of 22,  temperature is 97.7, and O2 sats on room air is 98%.  NECK:  Supple.  No elevated JVD.  No cervical lymphadenopathy.  No  carotid bruit.  CHEST:  Reduced air entry at the bases with no rales.  There are diffuse  expiratory rhonchi.  Heart sounds, 1 and 2 were heard with no murmurs.  No S3 gallops.  ABDOMEN:  Obese, soft, and nontender.  No masses.  Bowel sounds are  present.  EXTREMITIES:  No edema, calf tenderness or swelling.  CNS:  Alert and oriented x3 with no focal neurological deficits.   LABORATORY DATA:  BNP is 195.  Sodium is 137, potassium 4.4, chloride  106, glucose is 112, BUN 17, and creatinine 0.9.  Urinalysis positive  for moderate leukocytes, 3-6 wbc per high-power field.  Urine drug  screen is positive for cannabis and cocaine.  CT scan of the head showed  no acute intracranial abnormality.  There was right maxillary sinus  disease with a small amount of fluid.  Chest x-ray showed cardiomegaly  with vascular congestion with bibasilar atelectasis.   ASSESSMENT:  This 40 year old African American lady with history of  bronchial asthma and chronic obstructive pulmonary disease presented to  the emergency room at [facility] with few weeks of cough,  productive of greenish sputum, associated with shortness of breath.  She  apparently had decreased level of consciousness at the emergency room  and thus a CT scan was performed.  She has responded well to nebulized  bronchodilators and steroids and has been admitted to the hospital for  further treatment.   ADMISSION DIAGNOSES:  1. Chronic obstructive pulmonary disease with acute exacerbation.  2. Polysubstance abuse with positive cocaine and cannabinoids in the      urine.  3. History of tobacco abuse.  4. History of coronary artery disease, currently asymptomatic.  5. Urinary tract infection.   PLAN  OF CARE:  She will be admitted to a telemetry bed, 2 g of sodium  diet, vital signs q.4 h., activity ad lib, IV fluids with half normal  saline at 50 mL an hour, IV Solu-Medrol 600 mg q.6 h., nebulized  albuterol 2.5 mg q.i.d. and q.2 h. p.r.n., Protonix 40 mg for GI  prophylaxis p.o. daily, and  subcutaneous Lovenox 40 mg daily for DVT  prophylaxis.  She will be on Rocephin 1 g daily and azithromycin 500 mg  daily.  Strict I's and O's will be monitored and daily weights.  Her  home medication will be continued as above and adjusted according to her  response to the current therapeutic plan.  This plan of care has been  discussed with her.  She will be on nicotine patch for smoking  cessation.      Fleet Contras, M.D.  Electronically Signed     EA/MEDQ  D:  09/15/2008  T:  09/16/2008  Job:  [id number]

## 2010-07-22 NOTE — Op Note (Signed)
[name], [name] NO.:  [id number]   MEDICAL RECORD NO.:  [id number]          PATIENT TYPE:  AMB   LOCATION:  SDS                          FACILITY:  [facility]   PHYSICIAN:  Sharlet Salina T. Hoxworth, M.D.DATE OF BIRTH:  1970-12-21   DATE OF PROCEDURE:  11/16/2006  DATE OF DISCHARGE:  11/16/2006                               OPERATIVE REPORT   PRE AND POSTOPERATIVE DIAGNOSIS:  Right subareolar breast abscess.   SURGICAL PROCEDURES:  Incision and drainage of right breast abscess.   SURGEON:  Lorne Skeens. Hoxworth, M.D.   BRIEF HISTORY:  [name] is a 40 year old black female presented to  emergency room approximately two weeks ago with right breast pain and a  mass centrally in the breast.  Ultrasound at that time was positive for  an abscess.  Initially she was treated with antibiotics, had follow up  in our office a couple of days ago with a repeat ultrasound showing  persistent approximately 3 cm abscess, centrally in the right breast  projecting a little more toward the inferior aspect.  There is a tender  firm palpable mass deep beneath the areola.  I have recommended  proceeding with incision and drainage.  The nature of the procedure,  indications, risks of bleeding, infection were discussed and understood.  She is now brought to operating room for this procedure.   DESCRIPTION OF OPERATION:  The patient brought to operating room placed  in supine position on the operating table.  Laryngeal mask general  anesthesia was induced.  Right breast was widely sterilely prepped and  draped.  Correct patient and procedure were verified.  I made a  curvilinear incision inferolaterally at the areolar edge and dissection  was carried down through the subcutaneous tissue to the breast capsule  which was incised with the cautery down onto the palpable mass and a  several centimeter abscess was entered containing purulent material that  was cultured.  A few loculations were  broken up, the area widely opened  with the Bovie and thoroughly irrigated.  Hemostasis assured.  The  cavity was packed with moist saline gauze.  Sponge, needle and  instrument counts were correct.  Dry sterile dressing was applied and  the patient taken recovery in good condition.      Lorne Skeens. Hoxworth, M.D.  Electronically Signed     BTH/MEDQ  D:  11/16/2006  T:  11/17/2006  Job:  [id number]

## 2010-07-22 NOTE — Consult Note (Signed)
[name], BIHL NO.:  [id number]   MEDICAL RECORD NO.:  [id number]          PATIENT TYPE:  EMS   LOCATION:  ED                           FACILITY:  [facility]   PHYSICIAN:  Angelia Mould. Derrell Lolling, M.D.DATE OF BIRTH:  [birth year]   DATE OF CONSULTATION:  05/24/2007  DATE OF DISCHARGE:  05/24/2007                                 CONSULTATION   CHIEF COMPLAINT:  Right breast pain and swelling and nipple discharge.   HISTORY OF PRESENT ILLNESS:  This is a 40 year old black female who  presented to the [facility] Room today.  She was evaluated by  Dr. Cathren Laine.  He felt that she might have a recurrent right breast  abscess.  He called me to evaluate her.   The patient states and her record confirms that she underwent incision  and drainage of a right breast abscess throughout an inferior  circumareolar incision by Dr. Glenna Fellows on November 16, 2006.  The cultures showed no organisms.  She did recover.  For the past 3-4  weeks, she has been developing progressive pain in the retroareolar area  of the breast.  She has recently noted some purulent discharge the  breast.  This is very tender.  She denies fever or chills.  She has not  had any imaging studies.   PAST HISTORY:  1. Morbid obesity.  2. Coronary artery disease, status post myocardial infarction in 2004.  3. Status post incision and drainage of right breast abscess in      September 2008.  4. COPD and asthma.  5. History of herpes zoster of the back.  6. Morbid obesity.  7. History of right foot surgery.  8. History of cesarean section.  9. Gravida 3, para 3.   CURRENT MEDICATIONS:  None.  She used to take metoprolol, lisinopril,  and Norvasc, but she discontinued this on her own without medical advice  about 10 months ago.  She has not had any cardiac followup in some time.   DRUG ALLERGIES:  FLAGYL.   FAMILY HISTORY:  Mother died at age 35 from lung cancer.  She also had  hypertension.  Father died at age 42 from congestive heart failure.  He  also has COPD.  Three siblings; one has hypertension, one has herpes  zoster.   SOCIAL HISTORY:  The patient is single.  Her boyfriend is here with her.  She lives with her three children.  She smokes 1/2 pack of cigarettes  per day.  She drinks alcohol socially.  Denies drug abuse.  She is  unemployed, considering going to school.   REVIEW OF SYSTEMS:  A 15-system review of systems is performed and is  noncontributory, except as described above.   PHYSICAL EXAMINATION:  GENERAL:  Pleasant, morbidly obese black female,  in minimal distress.  VITAL SIGNS;  Temperature 98.8, blood pressure 139/84, pulse 90,  respirations 18.  HEENT:  Eyes:  Sclerae clear.  Extraocular movements intact.  Ears,  nose, mouth, and throat:  Nose, lips, tongue, and oropharynx are without  gross lesions.  NECK:  Supple, nontender.  No thyroid mass.  No adenopathy.  No jugular  distention.  LUNGS:  Clear to auscultation from anterior exam.  HEART:  Regular rate and rhythm.  I do not hear any murmurs or rubs.  Radial and femoral pulses are palpable.  BREASTS:  The right breast reveals a well-healed circumareolar incision  inferiorly.  There is a tender spherical 5 cm mass in the retroareolar  area projecting more inferiorly.  It is not fluctuant.  There is minimal  skin change.  There is some purulent drainage from the nipple with hard  compression.  This area was prepped the alcohol, anesthetized with 1%  Xylocaine, and needle aspiration revealed about 10 mL of tan purulent  fluid, and then there was no more fluid that I can obtain.  This was  sent for culture and sensitivity and Gram's stain of.  She tolerated  this well.  A clean bandage was placed.  ABDOMEN:  Obese, soft, nontender.  No mass.  EXTREMITIES:  She moves all four extremities well, without pain or  deformity.  NEUROLOGIC:  No gross motor or sensory deficits.    LABORATORY DATA:  Hemoglobin 10.5, white blood cell count 9200.   ASSESSMENT:  1. Recurrent right breast abscess.  2. Morbid obesity.  3. Coronary artery disease, history of myocardial infarction in 2004.  4. Noncompliance with medication and medical followup.  5. History of herpes zoster.  6. Chronic obstructive pulmonary disease and asthma.  7. Hypertension.   PLAN:  1. The patient will be given intravenous pain medication, and one dose      of IV Avelox.  2. We will send her right breast fluid for culture and sensitivity.  3. She is given a prescription for Avelox 400 mg daily x10 days, and a      prescription for Vicodin 30 tablets.   She is asked to make an appointment with her cardiologist, Dr. Lenise Herald, immediately so that she can be restarted on her  antihypertensive and cardiac medications.  It is not felt that she would  be safe for general anesthesia at this point.   I asked her to follow up with either me or Dr. Johna Sheriff in the office in  7-10 days.      Angelia Mould. Derrell Lolling, M.D.  Electronically Signed     HMI/MEDQ  D:  05/24/2007  T:  05/25/2007  Job:  [id number]   cc:   Darlin Priestly, MD  Fax: [phone]

## 2010-07-22 NOTE — Discharge Summary (Signed)
NAMESUMIKO, Farrell NO.:  [id number]   MEDICAL RECORD NO.:  [id number]          PATIENT TYPE:  INP   LOCATION:  2039                         FACILITY:  [facility]   PHYSICIAN:  [name], M.D.  DATE OF BIRTH:  08-27-1970   DATE OF ADMISSION:  12/25/2007  DATE OF DISCHARGE:  12/26/2007                               DISCHARGE SUMMARY   The patient was admitted by Dr. Orpah Farrell.   FINAL DIAGNOSES:  1. Chest pain, musculoskeletal.  2. Hypertension.  3. Asthma without status asthmaticus.  4. Obesity.   DISCHARGE MEDICATIONS:  1. Cardizem extended release 240 mg 1 daily.  2. Avapro 300 mg 1 daily.  3. Gemfibrozil 600 mg 1 twice daily.  4. Metoprolol 50 mg 1 twice daily.  5. Sertraline 50 mg 1 daily.  6. Simvastatin 40 mg 1 daily.  7. KCl 10 mEq 1 daily.  8. Tylenol 325 mg 2 4 times daily as needed for pain.  9. Flexeril 10 mg twice daily for 1-2 weeks.   DISCHARGE DIET:  Low-sodium, heart-healthy diet.   DISCHARGE ACTIVITY:  The patient to increase activity slowly.   SPECIAL INSTRUCTION:  The patient to stop any activity that causes chest  pain, shortness of breath, dizziness, sweating, or excessive weakness.   FOLLOWUP:  By Dr. Orpah Farrell in 2 weeks. The patient is to follow up  Dr. Bascom Farrell within 2 weeks.   HISTORY:  This is a 40 year old female who presented with left  precordial chest pain, increased with activity and breathing, also had  left shoulder pain.  She had a history of lifting heavy objects x2 days.   PAST MEDICAL HISTORY:  Positive for hypertension, asthma, and herpes  zoster.  The patient had cardiac catheterization in 2008, with normal  coronary.   PHYSICAL EXAMINATION:  VITAL SIGNS:  Temperature 98.2, pulse 78,  respirations 16, and blood pressure 110/52.  GENERAL: The patient is a well-built, well-nourished female in distress.  HEENT:  The patient is normocephalic and atraumatic.  Eyes, pupil equal  and reacting to light.   Eyes, no conjunctival sclerosis. Conjunctivae  pink.  Sclerae anicteric.  NECK:  Supple.  LUNGS:  Clear to auscultation.  Chest nontender on palpation over the  left precordial area.  HEART:  Normal S1 and S2.  ABDOMEN:  Soft and nontender.  EXTREMITIES:  No edema, cyanosis, or clubbing.  NEUROLOGICAL:  Cranial nerves grossly intact.  The patient is alert and  oriented x3.   LABORATORY DATA:  Normal hemoglobin, hematocrit, WBC count, and platelet  count.  Normal electrolytes, BUN, and creatinine.  Normal CK-MB,  troponin I negative x2.   Echocardiogram shows vigorous left ventricular systolic function with  ejection fraction of 75%, markedly increased left ventricular wall  thickness with 30 mm LV outflow tract gradient, mild mitral  regurgitation, moderate left ventricular hypertrophy.   EKG showed sinus rhythm with left ventricular hypertrophy and  repolarization abnormality.   HOSPITAL COURSE:  The patient was admitted to the telemetry unit.  Myocardial infarction was ruled out.  Her cardiac catheterization report  was  reviewed.  The patient had moderate improvement in her chest pain  with oral analgesics.  Her 2-D echocardiogram revealed hypertrophic  cardiomyopathy, most likely secondary to uncontrolled hypertension.  The  patient's medications were adjusted and she was discharged home in  satisfactory condition with follow up by me in 2 weeks and by primary  care physician in 2 weeks.      [name], M.D.  Electronically Signed     ASK/MEDQ  D:  02/15/2008  T:  02/16/2008  Job:  [id number]

## 2010-07-22 NOTE — Discharge Summary (Signed)
[name], RULE NO.:  [id number]   MEDICAL RECORD NO.:  [id number]          PATIENT TYPE:  INP   LOCATION:  5507                         FACILITY:  [facility]   PHYSICIAN:  Jarome Matin, M.D.DATE OF BIRTH:  1970/09/08   DATE OF ADMISSION:  09/15/2008  DATE OF DISCHARGE:  09/18/2008                               DISCHARGE SUMMARY   ADMITTING DIAGNOSIS:  Severe chronic obstructive pulmonary disease.   DISCHARGE DIAGNOSES:  1. Chronic obstructive pulmonary disease with obstructive asthma      exacerbation.  2. Urinary tract infection.  3. 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  4. Myocardial infarction.  5. Hypertension.  6. Morbid obesity.  7. Depressive disorder.  8. Tobacco abuse.   BRIEF HISTORY AND PHYSICAL AND HOSPITAL COURSE:  [name] is a 40-  year-old [place] American female with history of COPD, asthma.  She had  an MI in 2000.  She is morbidly obese, hypertensive, and tobacco abuse.  In the emergency room, she had been coughing and productive cough of  greenish sputum and shortness of breath.  She denied chest pain at this  time, fever, chills, or palpitation.  She also has a history of drug  abuse.  She was positive for cocaine and cannabis in the ER.  She also  has urinary tract infection.  The patient was admitted, given oxygen,  bedrest.   PHYSICAL EXAMINATION:  GENERAL:  A morbidly obese lady in bed.  VITAL SIGNS:  Blood pressure of 145/98, heart rate of 90, respiratory  rate of 22, temperature was 97.7.  At this point, O2 on room air was  98%.  NECK:  Supple.  No carotid bruits.  LUNGS:  At the base was decreased and some diffuse expiratory rhonchi.  ABDOMEN:  Soft, obese, nontender.  Active bowel sounds.  EXTREMITIES:  No leg edema.  No calf tenderness or swelling.   LABORATORY DATA:  BNP was 195.  Sodium 137, potassium 4.4, BUN 17, and  creatinine 0.7.  Urinalysis had leukocytes and some bacteria.   Urine  drug screen was positive for cannabis and cocaine.  Some sinus disease.  She had some vascular congestion on chest x-ray.  She admits to  continued smoking.   The patient was admitted, put to bedrest, IV and normal saline 50 mL an  hour.  Also some IV Solu-Medrol 50 mg q.6 h. nebulizer treatments,  albuterol 2.5 q.i.d., started on Protonix 40 mg daily for reflux.  She  was put on Lovenox 40 mg subcu daily, Xanax 0.25 mg q.6 h. as needed for  anxiety, Tylenol 650 q.6 h. p.r.n. for fever, started on Rocephin 1 g  daily, and Zithromax 500 mg daily IV.  She also has a history of  allergies.  She had been put on some Sudafed, put on Allegra-D twice a  day 60 mg, and we suspect that the patient has sleep apnea, so we want  to put her on a sleep labs as they generally do that as an outpatient,  so we will have her put on a sleep  study when she is discharged.  She is  probable going to need some BiPAP that may help her stay intact at home.  Blood gas that was done showed some evidence of elevated pCO2 of 49.6.  EKG was sinus arrhythmia, had right atrial abnormality, LVH.  The  patient gradually began to improve and feel better with the treatment of  COPD and antibiotics.  Discussed at length her need to stop smoking so  that she become stable and we are going to continue her on some  medications.   So, we are going to discharge her on Ceftin for another 7 days after  discharge 500 mg twice a day and Sudafed 60 mg daily 71 days.  She is to  take Abilify 5 mg daily for her bipolar situations, Lopid 600 mg b.i.d.,  Advair inhaler 250 mg/50 one puff twice a day, prednisone 5 mg twice a  day, and Claritin 10 mg daily, Ventolin inhaler HFC 2 puffs every 4  hours as needed for wheezing, and Tylox, she has one q.4-6 h. as needed  for pain.  We will start perhaps on some nicotine patch, we are going to  try to see if we can have her smoking nicotine as an outpatient 21 mg  and we will see if we  can gradually bring that down, Zocor 40 mg daily,  Lexapro 20 mg daily, Protonix 40 mg a day to help with her reflux,  Flexeril 10 mg t.i.d., Maxzide 25 one p.o. daily, Reglan 10 mg before  meals and at bedtime, also to help with reflux Benicar 40 mg p.o. daily.  She is to go for the sleep study as an outpatient and she is to come  back to the office in about 2 weeks.           ______________________________  Jarome Matin, M.D.     CEF/MEDQ  D:  10/11/2008  T:  10/11/2008  Job:  [id number]

## 2010-07-25 ENCOUNTER — Emergency Department (HOSPITAL_COMMUNITY): Payer: Medicaid Other

## 2010-07-25 ENCOUNTER — Emergency Department (HOSPITAL_COMMUNITY)
Admission: EM | Admit: 2010-07-25 | Discharge: 2010-07-25 | Disposition: A | Discharge status: Home / Self Care | Payer: Medicaid Other | Attending: Emergency Medicine | Admitting: Emergency Medicine

## 2010-07-25 DIAGNOSIS — K219 Gastro-esophageal reflux disease without esophagitis: Secondary | ICD-10-CM | POA: Insufficient documentation

## 2010-07-25 DIAGNOSIS — R42 Dizziness and giddiness: Secondary | ICD-10-CM | POA: Insufficient documentation

## 2010-07-25 DIAGNOSIS — H5710 Ocular pain, unspecified eye: Secondary | ICD-10-CM | POA: Insufficient documentation

## 2010-07-25 DIAGNOSIS — R609 Edema, unspecified: Secondary | ICD-10-CM | POA: Insufficient documentation

## 2010-07-25 DIAGNOSIS — E669 Obesity, unspecified: Secondary | ICD-10-CM | POA: Insufficient documentation

## 2010-07-25 DIAGNOSIS — R6884 Jaw pain: Secondary | ICD-10-CM | POA: Insufficient documentation

## 2010-07-25 DIAGNOSIS — I10 Essential (primary) hypertension: Secondary | ICD-10-CM | POA: Insufficient documentation

## 2010-07-25 DIAGNOSIS — M542 Cervicalgia: Secondary | ICD-10-CM | POA: Insufficient documentation

## 2010-07-25 DIAGNOSIS — R209 Unspecified disturbances of skin sensation: Secondary | ICD-10-CM | POA: Insufficient documentation

## 2010-07-25 DIAGNOSIS — I509 Heart failure, unspecified: Secondary | ICD-10-CM | POA: Insufficient documentation

## 2010-07-25 DIAGNOSIS — I252 Old myocardial infarction: Secondary | ICD-10-CM | POA: Insufficient documentation

## 2010-07-25 NOTE — Cardiovascular Report (Signed)
[name], [name]                           ACCOUNT NO.:  [id number]   MEDICAL RECORD NO.:  [id number]                   PATIENT TYPE:  OIB   LOCATION:  2031                                 FACILITY:  [facility]   PHYSICIAN:  Darlin Priestly, M.D.             DATE OF BIRTH:  Apr 03, 1970   DATE OF PROCEDURE:  06/15/2002  DATE OF DISCHARGE:                              CARDIAC CATHETERIZATION   PROCEDURE:  1. Left-heart catheterization.  2. Coronary angiography.  3. Left ventriculogram.  4. Ascending aortography.  5. Abdominal aortogram.   COMPLICATIONS:  None.   INDICATIONS:  The patient is 40 year old female, patient of Dr. Concepcion Elk with  a history of hypertension, and positive family history of CAD, history of  tobacco abuse, who was admitted to [facility] on 06/14/2002  complaining of substernal chest pain.  She had lateral T wave inversions on  admission.  She did subsequently rule in for myocardial infarction with CKs  in the 700s with an MB of 100 and troponin of 12.  She is now referred for  cardiac catheterization to assess her coronary status.   DESCRIPTION OF PROCEDURE:  After given informed written consent, the patient  was brought to the cardiac catheterization lab where she was shaved, prepped  and draped in the usual sterile fashion.  ECG monitoring was established.  Using the modified Seldinger technique, a #7 French arterial sheath was  inserted into the right femoral artery, and 6-French diagnostic catheter was  used to perform diagnostic angiography.  This revealed a large left main  with no significant disease.  The LAD is a large vessel that courses through  the apex and gives rise to two diagonal branches.  The LAD was noted to have  60% bridging segment in the proximal portion.  There is a 60% stenotic  lesion in the distal mid segment.  The first and second diagonals are medium  sized vessels with no significant disease.   FINDINGS:  LEFT  CORONARY ARTERY:  The left coronary artery also gave rise to a large  ramus intermedius which bifurcates distally.  It has no significant disease.   LEFT CIRCUMFLEX:  The left circumflex is a large vessel that courses in the  AV groove and gives rise to two obtuse marginal branches.  The AV groove and  circumflex has no significant disease.  The first obtuse marginal  is a  large vessel which bifurcates into the mid segment and has no significant  disease.   The second obtuse marginal is a medium-sized vessel with no significant  disease.   The right coronary artery is a large vessel which is dominant and gives rise  to a posterior descending artery/posterolateral branch.  The right coronary  artery/ posterior descending artery and posterolateral branch have no  significant disease.   LEFT VENTRICULOGRAM:  Reveals a super-normal ejection fraction of 75-80%  with mid-cavity obliteration.  ASCENDING AORTOGRAPHY:  Reveals no evidence of aortic dissection or  regurgitation.   Abdominal aortogram reveals no evidence of renal artery stenosis.   HEMODYNAMICS:  Systemic arterial pressure 121/83.  Left ventricular systemic  pressure 120/15.  LVEDP of 28.   CONCLUSIONS:  1. Significant one-vessel coronary artery disease involving a bridging     segment proximal left anterior descending.  2. Normal left ventricular systolic function with mid cavity obliteration.  3. No evidence of aortic dissection.  4. No evidence of renal artery stenosis.  5. Systemic hypertension.  6. Elevated left ventricular end-diastolic pressure.                                               Darlin Priestly, M.D.    RHM/MEDQ  D:  06/15/2002  T:  06/16/2002  Job:  [id number]   cc:   Fleet Contras, M.D.  [address]  [place]  [place] [address]  Fax: [phone]

## 2010-07-25 NOTE — Procedures (Signed)
NAME:  [name], [name] NO.:  [id number]   MEDICAL RECORD NO.:  [id number]          PATIENT TYPE:  OUT   LOCATION:  SLEEP CENTER                 FACILITY:  [facility]   PHYSICIAN:  Clinton D. Maple Hudson, MD, FCCP, FACPDATE OF BIRTH:  1970[birth year]02/01   DATE OF STUDY:  10/19/2008                            NOCTURNAL POLYSOMNOGRAM   REFERRING PHYSICIAN:  Jarome Matin, M.D.   INDICATION FOR STUDY:  Hypersomnia with sleep apnea.   EPWORTH SLEEPINESS SCORE:  5/24, BMI 53.7, weight 313 pounds, height 64  inches.  Neck 16.5 inches.   MEDICATIONS:  Home medications are charted and reviewed.   SLEEP ARCHITECTURE:  Split study protocol.  During the diagnostic phase,  total sleep time 172.5 minutes with sleep efficiency 75.2%.  Stage I was  9%, stage II 61.7%, stage III 29.3%, REM absent.  Sleep latency 53.5  minutes, awake after sleep onset 3.5 minutes, arousal index 23.3.  Bedtime medication:  Seroquel.   RESPIRATORY DATA:  Split study protocol.  Apnea-hypopnea index (AHI)  20.2 per hour.  A total of 58 events was scored including 1 obstructive  apnea and 57 hypopneas.  Events were most common while supine.  CPAP was  then titrated to 16 mL CWP, AHI 2.2 per hour.  She wore a medium ResMed  Quattro full-face mask with heated humidifier.   OXYGEN DATA:  Moderately loud snoring before CPAP with oxygen  desaturation to a nadir of 90%.  After CPAP control, mean oxygen  saturation held 96.2% on room air.   CARDIAC DATA:  Sinus rhythm with occasional PVC.   MOVEMENT-PARASOMNIA:  No significant movement disturbance.  Bathroom x2.   IMPRESSIONS-RECOMMENDATIONS:  1. Moderate obstructive sleep apnea/hypopnea syndrome, apnea-hypopnea      index 20.2 per hour.  Most events were while supine.  Moderately      loud snoring with oxygen desaturation to a nadir of 90%.  2. Successful continuous positive airway pressure titration to 16      centimeters of water pressure, apnea-hypopnea index  2.2 per hour.      She wore a medium ResMed Quattro full-face mask with heated      humidifier.      Clinton D. Maple Hudson, MD, [facility], FACP  Diplomate, Biomedical engineer of Sleep Medicine  Electronically Signed     CDY/MEDQ  D:  10/19/2008 15:20:07  T:  10/20/2008 00:27:01  Job:  [id number]

## 2010-07-25 NOTE — H&P (Signed)
[name], [name] NO.:  [id number]   MEDICAL RECORD NO.:  [id number]          PATIENT TYPE:  INP   LOCATION:  1826                         FACILITY:  [facility]   PHYSICIAN:  Hillery Aldo, M.D.   DATE OF BIRTH:  May 09, 1970   DATE OF ADMISSION:  06/20/2006  DATE OF DISCHARGE:                              HISTORY & PHYSICAL   PRIMARY CARE PHYSICIAN:  Dr. Della Goo.   CARDIOLOGIST:  Dr. Jenne Campus.   CHIEF COMPLAINT:  Chest pain, headache, lower back pain with  hyperesthesia.   HISTORY OF PRESENT ILLNESS:  The patient is a 40 year old female with  multiple complaints including headache, hyperesthesia and pain in her  lower back at the site of a previous shingles eruption, and chest pain  which is exertional.  The patient also complains of longstanding  dyspnea, a nonproductive cough, chills without fever, myalgias, and  increased stress secondary to the recent death of her mother.  The  patient reports that her chest pain has been going on for the past week  and a half and varies in severity.  It does worsen with activity.  It is  similar to her prior chest pain with her myocardial infarction.  She is  currently chest pain free but due to abnormalities on her 12-lead EKG  and mildly elevated troponins, she is being admitted for further  evaluation and workup.   PAST MEDICAL HISTORY:  1. Hypertension.  2. COPD/asthma.  3. Coronary artery disease status post MI in 2004.  Catheterization at      that time revealed one-vessel coronary artery disease of a bridging      segment of the proximal LAD.  She had preserved LV function.  4. History of herpes zoster affecting her lower back.  5. History of right foot surgery.   FAMILY HISTORY:  The patient's mother died at 74 from lung cancer.  She  also had hypertension.  The patient's father died of 58 from congestive  heart failure.  He also had COPD.  The patient has three siblings, one  has hypertension and  one has had herpes zoster.  The other is healthy.   SOCIAL HISTORY:  The patient is single and lives with her children.  She  smokes one-half pack of tobacco daily.  She drinks alcohol socially.  She denies any drug use.  She is unemployed.  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   ALLERGIES:  FLAGYL causes hives.   CURRENT MEDICATIONS:  Metoprolol, Advair, Norvasc, Caduet.   Note, the patient is unsure of her dosages of these medicines and states  she ran out of them 2 days ago.  She denies daily aspirin intake.   REVIEW OF SYSTEMS:  As noted in the elements of the HPI above.  Otherwise, the patient denies any melena, hematochezia, nausea or  vomiting.  She has had some intermittent diarrhea.   PHYSICAL EXAM:  VITAL SIGNS:  Temperature 97.9, pulse 81, respirations  24, blood pressure 156/80, O2 saturation 99% on room air.  GENERAL:  This is  a well-developed, well-nourished obese female in no  acute distress.  HEENT:  Normocephalic, atraumatic.  PERRL.  EOMI.  Oropharynx:  Clear.  NECK:  Supple, no thyromegaly, no lymphadenopathy, no jugular venous  distention.  CHEST:  Coarse breath sounds bilaterally but clear.  No wheezes or  rales.  HEART:  Regular rate, rhythm.  No murmurs, rubs, or gallops.  ABDOMEN:  Soft, nontender, nondistended, with normoactive bowel sounds.  EXTREMITIES:  Trace edema bilaterally.  No clubbing or cyanosis.  SKIN:  Warm and dry.  No rashes.  Specifically, no vesicles to her lower  back.  NEUROLOGIC:  The patient is alert and oriented x3.  Cranial nerves, II  through XII, are grossly intact.  Nonfocal.   DATA REVIEW:  Chest x-ray shows no active cardiopulmonary disease.   A 12-lead EKG shows normal sinus rhythm with biatrial enlargement and  diffuse T-wave inversions.   LABORATORY DATA:  White blood cell count is 9.1, hemoglobin 13.2,  hematocrit 38, platelets 338, with an absolute neutrophil count of 5.4  and an MCV of 86.   Sodium is 141, potassium 3.1, chloride 110, bicarb  22.6, BUN 13, creatinine 1, glucose 81.  Cardiac markers reveal a  myoglobin of 64.1, CK-MB of 3, and troponin I of 0.09 and 0.06.   ASSESSMENT AND PLAN:  1. Chest pain:  The patient has significant cardiac risk factors      including prior MI, obesity, tobacco abuse and hypertension.  Given      this, we need to rule out acute coronary syndrome, especially since      her troponin I is mildly elevated.  She also has abnormalities on      12-lead EKG.  I will therefore start her empirically on heparin and      use nitroglycerin and morphine p.r.n. for any recurrence of her      chest pain.  We will also start her on antiplatelet therapy.  I      will continue her beta blocker.  Given her significant risk      factors, cardiology consultation has been called by the ED      physicians.  We will check the patient's 2-D echocardiogram and BNP      to rule out left ventricular dysfunction and CHF given that she has      had a prior MI.  2. Hypokalemia:  We will replete the patient's potassium and check a      magnesium to ensure that she is not magnesium deficient.  3. Hypertension:  We will need to verify her home doses of      medications.  At this point, we will start her on 25 mg metoprolol      b.i.d., and 10 mg of Norvasc, and adjust her blood pressure      medicines if needed.  4. Chronic obstructive pulmonary disease:  The patient will be put on      nebulized bronchodilator therapy and continued on her Advair.  We      will monitor her respiratory status closely.  5. Tobacco abuse:  Initiate tobacco cessation counseling and start her      on a nicotine patch if needed for control of      her cravings.  6. Prophylaxis:  Initiate GI prophylaxis with Protonix and DVT      prophylaxis will be covered with her heparin drip.      Hillery Aldo, M.D.  Electronically Signed  CR/MEDQ  D:  06/20/2006  T:  06/20/2006  Job:   [id number]   cc:   Della Goo, M.D.  Darlin Priestly, MD

## 2010-07-25 NOTE — Cardiovascular Report (Signed)
[name], [name] NO.:  [id number]   MEDICAL RECORD NO.:  [id number]          PATIENT TYPE:  INP   LOCATION:  3737                         FACILITY:  [facility]   PHYSICIAN:  Cristy Hilts. Jacinto Halim, MD       DATE OF BIRTH:  [birth year]   DATE OF PROCEDURE:  06/20/2006  DATE OF DISCHARGE:                            CARDIAC CATHETERIZATION   PROCEDURE PERFORMED:  1. Left ventriculography.  2. Selective right and left coronary arteriography.  3. Abdominal aortogram.   INDICATIONS:  [name] is a 40 year old female with history of  nontransmural myocardial infarction that occurred in 2004 and was  attributed to an intramyocardial bridging in the proximal LAD.  She is  readmitted to the hospital with chest pain.  She has morbid obesity,  hypertension and hyperlipidemia.  Given this, she was now brought back  to the cardiac catheterization laboratory to evaluate her coronary  anatomy.   She has COPD and continued tobacco use.   HEMODYNAMIC DATA:  The left ventricular pressure was 115//3 with an end-  diastolic pressure of 21 mmHg.  The aortic pressure was 117/74 with a  mean of 96 mmHg.  There was no pressure gradient across the aortic  valve.   Right coronary:  The right coronary artery is a large-caliber vessel and  a dominant vessel.  It is smooth and normal.   Left main:  The left main is a large-caliber vessel.  It is smooth and  normal.   Circumflex:  The circumflex is large-caliber vessel.  It gives origin to  a high obtuse marginal one and a large obtuse marginal two.  It is  smooth and normal.  It also gives an AV groove branch which is smooth  and normal.   LAD:  The LAD is a large-caliber vessel.  It gives origin to two small  diagonals.  It is smooth and normal.  There is mild kink noted in the  mid LAD but there is no intramyocardial bridging or any significant  stenosis noted in the LAD.   ABDOMINAL AORTOGRAM:  Abdominal aortogram revealed  presence of two renal  arteries, one on either side.  They are widely patent.  There was no  evidence of abdominal aneurysm.   INTERPRETATION:  1. Supranormal left ventricular systolic function, ejection fraction      75%, with cavitary obliteration.  No significant mitral      regurgitation.  2. Elevated left ventricular end-diastolic pressure suggestive of      hypertension with hypertensive heart disease, morbid obesity      contributing.   RECOMMENDATIONS:  Evaluation for noncardiac cause of chest pain is  indicated.  She also appears to have significant sleep apnea in the  cardiac catheterization laboratory.  She may benefit from outpatient  sleep study.  She will follow up with Dr. Della Goo for further  management.   A total of 120 mL of contrast was utilized for diagnostic angiography.   TECHNIQUE OF THE PROCEDURE:  Under usual sterile precautions using a 6-  French right femoral arterial access, a  multipurpose B2 catheter was  advanced into the left ventricle and left ventriculography performed  both in LAO and RAO projection.  Then the catheter was utilized to  engage the right coronary artery and angiography was performed.  The  same catheter was pulled back into the abdominal aorta and abdominal  aortogram was performed.  Then the catheter was pulled out of the body  in usual fashion.  A J-wire was utilized for catheter exchange.  A 6-  J[place]ica Judkins left 3.5 diagnostic catheter was utilized to engage the  left main coronary artery and angiography was performed.  Then the  catheter was pulled out of the body in the usual fashion.  Right femoral  angiography was performed through the arterial access and access closed  with StarClose with excellent hemostasis.  The patient tolerated the  procedure.  No immediate complication noted.      Cristy Hilts. Jacinto Halim, MD  Electronically Signed     JRG/MEDQ  D:  06/21/2006  T:  06/21/2006  Job:  [id number]   cc:   Della Goo, M.D.

## 2010-07-25 NOTE — Discharge Summary (Signed)
[name], DESA NO.:  [id number]   MEDICAL RECORD NO.:  [id number]          PATIENT TYPE:  INP   LOCATION:  3730                         FACILITY:  [facility]   PHYSICIAN:  Ricki Rodriguez, M.D.  DATE OF BIRTH:  February 03, 1971   DATE OF ADMISSION:  06/08/2007  DATE OF DISCHARGE:  06/10/2007                               DISCHARGE SUMMARY   FINAL DIAGNOSES:  1. Acute systolic and diastolic heart failure.  2. Hypertension.  3. Chronic obstructive asthma.  4. Native coronary vessel atherosclerosis.  5. Tobacco use disorder.  6. Inflammatory disease of the breast.  7. Shoulder joint pain.   DISCHARGE MEDICATIONS:  1. Avelox 400 mg 1 daily.  2. Avapro 300 mg 1 daily.  3. Zocor 40 mg 1 daily.  4. Norvasc 5 mg 1 daily.  5. Benadryl 25 mg twice daily.  6. Metoprolol 50 mg 1 twice daily.  7. KCl 10 mEq 1 daily.  8. Flexeril 10 mg half a tablet twice daily.   DISCHARGE DIET:  Low-sodium, heart-healthy diet.   DISCHARGE ACTIVITY:  The patient to increase activity slowly.   SPECIAL INSTRUCTION:  The patient to stop any activity that causes chest  pain, shortness of breath, dizziness, sweating, or weakness.   The patient to get a mammogram of the right breast and hold aspirin  until seen by the surgeon.   Followup by Dr. Orpah Cobb in 1-2 weeks and Dr. Derrell Lolling in 2-4 weeks.  The patient to call [phone] and [phone] respectively.   HISTORY:  This is a 40 year old black who female presented with chest  pain with a precordial heaviness.  The patient was also out of  medications for 1-1/2 years.  She also had some shortness of breath and  has a known history of coronary artery disease.   PHYSICAL EXAMINATION:  VITAL SIGNS:  Temperature 98.6, pulse 81,  respirations 18, blood pressure 175/89, and oxygen saturation 99%.  GENERAL:  The patient is a well-developed and well-nourished black  female in no acute distress.  HEENT:  The patient is normocephalic and  atraumatic with brown eyes.  Pupil equal and reactive to light.  NECK:  Supple.  No JVD.  LUNGS:  Clear.  HEART:  Normal S1, S2.  ABDOMEN:  Soft and distended.  EXTREMITY:  Trace edema bilaterally.  SKIN:  Warm and dry.  NEUROLOGICAL:  The patient moves all four extremities.  Cranial nerves  are intact.   LABORATORY DATA:  Elevated BNP of 404, normal WBC count, platelet count,  hemoglobin, and hematocrit.  Normal INR, near-normal electrolytes with a  potassium of 3.3, subsequent potassium of 3.6, CK-MB and troponin I  negative x3, cholesterol 190 with HDL of 30 and LDL cholesterol of 136.   X-ray of the chest revealed no acute findings.   A 2-D echo of the heart showed hyperdynamic wall motion with morbidly  increased left ventricular wall thickness and significant diastolic  dysfunction.  An LV outflow tract gradient of 11 mm.   HOSPITAL COURSE:  The patient was admitted to telemetry unit.  She was  given IV Lasix with some relief.  The patient then complained of right  shoulder pain and had right breast pain.  This was followed in the past  by Dr. Derrell Lolling who recommended using NSAIDs.  The patient's beta blocker  dose was increased after a 2-D ultrasound which showed mild LVH.  The  patient has been treated with Avelox 400 mg and outpatient arrangement  was made to treat her in the hospice.  This patient's overall condition  was stable.  Aspirin was held, and she was discharged home in  satisfactory condition.      Ricki Rodriguez, M.D.  Electronically Signed     ASK/MEDQ  D:  07/13/2007  T:  07/14/2007  Job:  [id number]

## 2010-07-31 ENCOUNTER — Emergency Department (HOSPITAL_COMMUNITY)
Admission: EM | Admit: 2010-07-31 | Discharge: 2010-07-31 | Disposition: A | Discharge status: Home / Self Care | Payer: Medicaid Other | Attending: Emergency Medicine | Admitting: Emergency Medicine

## 2010-07-31 ENCOUNTER — Inpatient Hospital Stay (INDEPENDENT_AMBULATORY_CARE_PROVIDER_SITE_OTHER)
Admission: RE | Admit: 2010-07-31 | Discharge: 2010-07-31 | Disposition: A | Discharge status: Home / Self Care | Payer: Medicaid Other | Source: Ambulatory Visit | Attending: Emergency Medicine | Admitting: Emergency Medicine

## 2010-07-31 DIAGNOSIS — S058X9A Other injuries of unspecified eye and orbit, initial encounter: Secondary | ICD-10-CM | POA: Insufficient documentation

## 2010-07-31 DIAGNOSIS — H5789 Other specified disorders of eye and adnexa: Secondary | ICD-10-CM | POA: Insufficient documentation

## 2010-07-31 DIAGNOSIS — H11429 Conjunctival edema, unspecified eye: Secondary | ICD-10-CM | POA: Insufficient documentation

## 2010-07-31 DIAGNOSIS — F329 Major depressive disorder, single episode, unspecified: Secondary | ICD-10-CM | POA: Insufficient documentation

## 2010-07-31 DIAGNOSIS — Z79899 Other long term (current) drug therapy: Secondary | ICD-10-CM | POA: Insufficient documentation

## 2010-07-31 DIAGNOSIS — H5710 Ocular pain, unspecified eye: Secondary | ICD-10-CM | POA: Insufficient documentation

## 2010-07-31 DIAGNOSIS — H538 Other visual disturbances: Secondary | ICD-10-CM | POA: Insufficient documentation

## 2010-07-31 DIAGNOSIS — R51 Headache: Secondary | ICD-10-CM | POA: Insufficient documentation

## 2010-07-31 DIAGNOSIS — F3289 Other specified depressive episodes: Secondary | ICD-10-CM | POA: Insufficient documentation

## 2010-07-31 DIAGNOSIS — H1130 Conjunctival hemorrhage, unspecified eye: Secondary | ICD-10-CM | POA: Insufficient documentation

## 2010-07-31 DIAGNOSIS — K219 Gastro-esophageal reflux disease without esophagitis: Secondary | ICD-10-CM | POA: Insufficient documentation

## 2010-07-31 DIAGNOSIS — R220 Localized swelling, mass and lump, head: Secondary | ICD-10-CM | POA: Insufficient documentation

## 2010-07-31 DIAGNOSIS — F0781 Postconcussional syndrome: Secondary | ICD-10-CM | POA: Insufficient documentation

## 2010-07-31 DIAGNOSIS — J3489 Other specified disorders of nose and nasal sinuses: Secondary | ICD-10-CM | POA: Insufficient documentation

## 2010-07-31 DIAGNOSIS — I10 Essential (primary) hypertension: Secondary | ICD-10-CM | POA: Insufficient documentation

## 2010-07-31 DIAGNOSIS — H53149 Visual discomfort, unspecified: Secondary | ICD-10-CM | POA: Insufficient documentation

## 2010-07-31 DIAGNOSIS — H11419 Vascular abnormalities of conjunctiva, unspecified eye: Secondary | ICD-10-CM | POA: Insufficient documentation

## 2010-07-31 DIAGNOSIS — I252 Old myocardial infarction: Secondary | ICD-10-CM | POA: Insufficient documentation

## 2010-07-31 DIAGNOSIS — I509 Heart failure, unspecified: Secondary | ICD-10-CM | POA: Insufficient documentation

## 2010-07-31 DIAGNOSIS — H109 Unspecified conjunctivitis: Secondary | ICD-10-CM | POA: Insufficient documentation

## 2010-08-17 ENCOUNTER — Emergency Department (HOSPITAL_COMMUNITY)
Admission: EM | Admit: 2010-08-17 | Discharge: 2010-08-18 | Disposition: A | Discharge status: Other Acute Inpatient Hospital | Payer: Medicaid Other | Source: Home / Self Care | Attending: Emergency Medicine | Admitting: Emergency Medicine

## 2010-08-17 LAB — COMPREHENSIVE METABOLIC PANEL
ALT: 18 U/L (ref 0–35)
AST: 18 U/L (ref 0–37)
Albumin: 3.5 g/dL (ref 3.5–5.2)
Alkaline Phosphatase: 97 U/L (ref 39–117)
Chloride: 101 mEq/L (ref 96–112)
Potassium: 3.3 mEq/L — ABNORMAL LOW (ref 3.5–5.1)
Total Bilirubin: 0.2 mg/dL — ABNORMAL LOW (ref 0.3–1.2)

## 2010-08-17 LAB — URINE MICROSCOPIC-ADD ON

## 2010-08-17 LAB — DIFFERENTIAL
Basophils Absolute: 0 10*3/uL (ref 0.0–0.1)
Eosinophils Relative: 2 % (ref 0–5)
Lymphocytes Relative: 36 % (ref 12–46)
Neutro Abs: 4.6 10*3/uL (ref 1.7–7.7)
Neutrophils Relative %: 55 % (ref 43–77)

## 2010-08-17 LAB — URINALYSIS, ROUTINE W REFLEX MICROSCOPIC
Bilirubin Urine: NEGATIVE
Glucose, UA: NEGATIVE mg/dL
Ketones, ur: NEGATIVE mg/dL
Nitrite: NEGATIVE
pH: 6 (ref 5.0–8.0)

## 2010-08-17 LAB — RAPID URINE DRUG SCREEN, HOSP PERFORMED
Barbiturates: NOT DETECTED
Benzodiazepines: NOT DETECTED
Cocaine: POSITIVE — AB

## 2010-08-17 LAB — CBC
HCT: 40.1 % (ref 36.0–46.0)
RDW: 16.2 % — ABNORMAL HIGH (ref 11.5–15.5)
WBC: 8.3 10*3/uL (ref 4.0–10.5)

## 2010-08-18 ENCOUNTER — Inpatient Hospital Stay (HOSPITAL_COMMUNITY)
Admission: AD | Admit: 2010-08-18 | Discharge: 2010-08-22 | DRG: 885 | Disposition: A | Discharge status: Home / Self Care | Payer: Medicaid Other | Source: Ambulatory Visit | Attending: Psychiatry | Admitting: Psychiatry

## 2010-08-18 DIAGNOSIS — I10 Essential (primary) hypertension: Secondary | ICD-10-CM

## 2010-08-18 DIAGNOSIS — Z7982 Long term (current) use of aspirin: Secondary | ICD-10-CM

## 2010-08-18 DIAGNOSIS — J449 Chronic obstructive pulmonary disease, unspecified: Secondary | ICD-10-CM

## 2010-08-18 DIAGNOSIS — F332 Major depressive disorder, recurrent severe without psychotic features: Principal | ICD-10-CM

## 2010-08-18 DIAGNOSIS — I2510 Atherosclerotic heart disease of native coronary artery without angina pectoris: Secondary | ICD-10-CM

## 2010-08-18 DIAGNOSIS — I252 Old myocardial infarction: Secondary | ICD-10-CM

## 2010-08-18 DIAGNOSIS — F339 Major depressive disorder, recurrent, unspecified: Secondary | ICD-10-CM

## 2010-08-18 DIAGNOSIS — F1410 Cocaine abuse, uncomplicated: Secondary | ICD-10-CM

## 2010-08-18 DIAGNOSIS — F1210 Cannabis abuse, uncomplicated: Secondary | ICD-10-CM

## 2010-08-18 DIAGNOSIS — E876 Hypokalemia: Secondary | ICD-10-CM

## 2010-08-18 DIAGNOSIS — E785 Hyperlipidemia, unspecified: Secondary | ICD-10-CM

## 2010-08-18 DIAGNOSIS — Z818 Family history of other mental and behavioral disorders: Secondary | ICD-10-CM

## 2010-08-18 DIAGNOSIS — I509 Heart failure, unspecified: Secondary | ICD-10-CM

## 2010-08-18 DIAGNOSIS — I422 Other hypertrophic cardiomyopathy: Secondary | ICD-10-CM

## 2010-08-18 DIAGNOSIS — F1994 Other psychoactive substance use, unspecified with psychoactive substance-induced mood disorder: Secondary | ICD-10-CM

## 2010-08-18 DIAGNOSIS — R45851 Suicidal ideations: Secondary | ICD-10-CM

## 2010-08-18 DIAGNOSIS — J4489 Other specified chronic obstructive pulmonary disease: Secondary | ICD-10-CM

## 2010-08-18 DIAGNOSIS — K219 Gastro-esophageal reflux disease without esophagitis: Secondary | ICD-10-CM

## 2010-08-19 DIAGNOSIS — F1210 Cannabis abuse, uncomplicated: Secondary | ICD-10-CM

## 2010-08-19 DIAGNOSIS — F1410 Cocaine abuse, uncomplicated: Secondary | ICD-10-CM

## 2010-08-19 DIAGNOSIS — F339 Major depressive disorder, recurrent, unspecified: Secondary | ICD-10-CM
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
## 2010-12-01 LAB — WOUND CULTURE

## 2010-12-01 LAB — DIFFERENTIAL
Basophils Absolute: 0
Eosinophils Relative: 1
Lymphocytes Relative: 21
Lymphs Abs: 2
Neutro Abs: 6.8
Neutrophils Relative %: 74

## 2010-12-01 LAB — COMPREHENSIVE METABOLIC PANEL
AST: 14
BUN: 12
CO2: 24
Calcium: 8.4
Creatinine, Ser: 0.75
GFR calc Af Amer: >60
GFR calc non Af Amer: >60

## 2010-12-01 LAB — PROTIME-INR
INR: 1
Prothrombin Time: 13.6

## 2010-12-01 LAB — CBC
HCT: 31 — ABNORMAL LOW
Hemoglobin: 10.5 — ABNORMAL LOW
MCHC: 33.9
MCV: 85.2
Platelets: 291
RBC: 3.64 — ABNORMAL LOW
RDW: 15
WBC: 9.2

## 2010-12-01 LAB — I-STAT 8, (EC8 V) (CONVERTED LAB)
BUN: 3 — ABNORMAL LOW
Bicarbonate: 23.3
Chloride: 107
Glucose, Bld: 84
HCT: 41
Hemoglobin: 13.9
Operator id: 288831
Potassium: 3.7
Sodium: 138
TCO2: 24
pCO2, Ven: 32.6 — ABNORMAL LOW
pH, Ven: 7.462 — ABNORMAL HIGH

## 2010-12-01 LAB — POCT CARDIAC MARKERS
CKMB, poc: 2.1
Troponin i, poc: 0.27 — ABNORMAL HIGH

## 2010-12-01 LAB — POCT I-STAT CREATININE
Creatinine, Ser: 0.9
Operator id: 288831

## 2010-12-01 LAB — TROPONIN I

## 2010-12-02 LAB — COMPREHENSIVE METABOLIC PANEL WITH GFR
ALT: 14
AST: 16
Albumin: 3.8
Alkaline Phosphatase: 95
BUN: 8
CO2: 24
Calcium: 9.2
Chloride: 110
Creatinine, Ser: 0.92
GFR calc non Af Amer: >60
Glucose, Bld: 97
Potassium: 3.3 — ABNORMAL LOW
Sodium: 140
Total Bilirubin: 0.3
Total Protein: 6.8

## 2010-12-02 LAB — BASIC METABOLIC PANEL WITH GFR
BUN: 10
CO2: 21
Calcium: 8.6
Chloride: 107
Creatinine, Ser: 0.8
GFR calc non Af Amer: >60
Glucose, Bld: 103 — ABNORMAL HIGH
Potassium: 3.6
Sodium: 136

## 2010-12-02 LAB — CBC
HCT: 34.6 — ABNORMAL LOW
HCT: 35.8 — ABNORMAL LOW
HCT: 38
Hemoglobin: 11.6 — ABNORMAL LOW
Hemoglobin: 12.1
Hemoglobin: 12.8
MCHC: 33.6
MCHC: 33.6
MCHC: 33.7
MCV: 85.7
MCV: 85.8
Platelets: 328
Platelets: 328
RBC: 4.03
RBC: 4.17
RBC: 4.47
RDW: 14.8
RDW: 15.1
WBC: 10
WBC: 7.5

## 2010-12-02 LAB — APTT: aPTT: 34

## 2010-12-02 LAB — CK TOTAL AND CKMB (NOT AT ARMC)
CK, MB: 1.8
CK, MB: 2.1
Relative Index: 1.5
Relative Index: 1.7
Total CK: 118
Total CK: 126

## 2010-12-02 LAB — LIPID PANEL
HDL: 30 — ABNORMAL LOW
HDL: 34 — ABNORMAL LOW
Total CHOL/HDL Ratio: 4.8
Total CHOL/HDL Ratio: 6.3
Triglycerides: 119
Triglycerides: 68
VLDL: 14
VLDL: 24

## 2010-12-02 LAB — TROPONIN I

## 2010-12-02 LAB — PROTIME-INR
INR: 1
Prothrombin Time: 13.4

## 2010-12-02 LAB — HEPARIN LEVEL (UNFRACTIONATED): Heparin Unfractionated: 0.52

## 2010-12-09 LAB — CBC
Hemoglobin: 13
Platelets: 331
RDW: 14.2

## 2010-12-09 LAB — PROTIME-INR
INR: 1.1
Prothrombin Time: 14.4

## 2010-12-09 LAB — CK TOTAL AND CKMB (NOT AT ARMC)
CK, MB: 2.5
Relative Index: 1.3
Relative Index: 1.9
Total CK: 174

## 2010-12-09 LAB — POCT I-STAT, CHEM 8
BUN: 9
Chloride: 112
Sodium: 142

## 2010-12-09 LAB — DIFFERENTIAL
Basophils Absolute: 0.1
Lymphocytes Relative: 30
Monocytes Absolute: 0.5
Neutro Abs: 6.1

## 2010-12-09 LAB — POCT CARDIAC MARKERS
CKMB, poc: 1.1
Myoglobin, poc: 42.9
Troponin i, poc: 0.12 — ABNORMAL HIGH

## 2010-12-09 LAB — LIPID PANEL
HDL: 24 — ABNORMAL LOW
Total CHOL/HDL Ratio: 3.8
VLDL: 32

## 2010-12-19 LAB — BASIC METABOLIC PANEL
BUN: 12
CO2: 25
Chloride: 106
GFR calc Af Amer: >60
Potassium: 3.1 — ABNORMAL LOW

## 2010-12-19 LAB — ANAEROBIC CULTURE

## 2010-12-19 LAB — URINALYSIS, ROUTINE W REFLEX MICROSCOPIC
Bilirubin Urine: NEGATIVE
Glucose, UA: NEGATIVE
Hgb urine dipstick: NEGATIVE
Ketones, ur: NEGATIVE
Protein, ur: NEGATIVE

## 2010-12-19 LAB — DIFFERENTIAL
Eosinophils Absolute: 0.2
Eosinophils Relative: 2
Lymphs Abs: 3.4 — ABNORMAL HIGH
Monocytes Relative: 7

## 2010-12-19 LAB — CBC
HCT: 34.9 — ABNORMAL LOW
MCV: 88
RBC: 3.96
WBC: 8.3

## 2010-12-19 LAB — CULTURE, ROUTINE-ABSCESS: Culture: NO GROWTH

## 2010-12-19 LAB — GRAM STAIN

## 2010-12-22 LAB — CBC
HCT: 39.3
MCV: 88
RBC: 4.47
WBC: 10.1

## 2011-12-29 ENCOUNTER — Emergency Department (HOSPITAL_COMMUNITY)
Admission: EM | Admit: 2011-12-29 | Discharge: 2011-12-29 | Disposition: A | Discharge status: Home / Self Care | Payer: Medicaid Other | Attending: Emergency Medicine | Admitting: Emergency Medicine

## 2011-12-29 ENCOUNTER — Encounter (HOSPITAL_COMMUNITY): Payer: Self-pay | Admitting: Cardiology

## 2011-12-29 ENCOUNTER — Emergency Department (HOSPITAL_COMMUNITY): Payer: Medicaid Other

## 2011-12-29 DIAGNOSIS — R109 Unspecified abdominal pain: Secondary | ICD-10-CM | POA: Insufficient documentation

## 2011-12-29 DIAGNOSIS — F17200 Nicotine dependence, unspecified, uncomplicated: Secondary | ICD-10-CM | POA: Insufficient documentation

## 2011-12-29 DIAGNOSIS — A599 Trichomoniasis, unspecified: Secondary | ICD-10-CM

## 2011-12-29 DIAGNOSIS — Z79899 Other long term (current) drug therapy: Secondary | ICD-10-CM | POA: Insufficient documentation

## 2011-12-29 DIAGNOSIS — A5901 Trichomonal vulvovaginitis: Secondary | ICD-10-CM | POA: Insufficient documentation

## 2011-12-29 DIAGNOSIS — Z8709 Personal history of other diseases of the respiratory system: Secondary | ICD-10-CM | POA: Insufficient documentation

## 2011-12-29 DIAGNOSIS — I252 Old myocardial infarction: Secondary | ICD-10-CM | POA: Insufficient documentation

## 2011-12-29 DIAGNOSIS — I10 Essential (primary) hypertension: Secondary | ICD-10-CM | POA: Insufficient documentation

## 2011-12-29 HISTORY — DX: Essential (primary) hypertension: I10

## 2011-12-29 HISTORY — DX: Acute myocardial infarction, unspecified: I21.9

## 2011-12-29 LAB — URINALYSIS, ROUTINE W REFLEX MICROSCOPIC
Glucose, UA: NEGATIVE mg/dL
Ketones, ur: 15 mg/dL — AB
pH: 7 (ref 5.0–8.0)

## 2011-12-29 LAB — BASIC METABOLIC PANEL
CO2: 24 mEq/L (ref 19–32)
Chloride: 105 mEq/L (ref 96–112)
Creatinine, Ser: 0.82 mg/dL (ref 0.50–1.10)

## 2011-12-29 LAB — URINE MICROSCOPIC-ADD ON

## 2011-12-29 LAB — CBC
HCT: 32.4 % — ABNORMAL LOW (ref 36.0–46.0)
Hemoglobin: 10.3 g/dL — ABNORMAL LOW (ref 12.0–15.0)
MCV: 77 fL — ABNORMAL LOW (ref 78.0–100.0)
RBC: 4.21 MIL/uL (ref 3.87–5.11)
WBC: 6.9 10*3/uL (ref 4.0–10.5)

## 2011-12-29 MED ORDER — ONDANSETRON HCL 4 MG/2ML IJ SOLN
4.0000 mg | Freq: Four times a day (QID) | INTRAMUSCULAR | Status: DC | PRN
  Administered 2011-12-29: 4 mg via INTRAVENOUS
  Filled 2011-12-29: qty 2

## 2011-12-29 MED ORDER — POTASSIUM CHLORIDE CRYS ER 20 MEQ PO TBCR: 20.0000 meq | EXTENDED_RELEASE_TABLET | Freq: Two times a day (BID) | ORAL | Status: DC

## 2011-12-29 MED ORDER — MORPHINE SULFATE 4 MG/ML IJ SOLN
4.0000 mg | Freq: Once | INTRAMUSCULAR | Status: AC
  Administered 2011-12-29: 4 mg via INTRAVENOUS
  Filled 2011-12-29: qty 1

## 2011-12-29 MED ORDER — HYDROMORPHONE HCL PF 1 MG/ML IJ SOLN
1.0000 mg | Freq: Once | INTRAMUSCULAR | Status: AC
  Administered 2011-12-29: 1 mg via INTRAVENOUS
  Filled 2011-12-29: qty 1

## 2011-12-29 MED ORDER — HYDROCODONE-ACETAMINOPHEN 5-325 MG PO TABS
1.0000 | ORAL_TABLET | Freq: Once | ORAL | Status: AC
  Administered 2011-12-29: 1 via ORAL
  Filled 2011-12-29: qty 1

## 2011-12-29 MED ORDER — SODIUM CHLORIDE 0.9 % IV BOLUS (SEPSIS)
1000.0000 mL | INTRAVENOUS | Status: AC
  Administered 2011-12-29: 1000 mL via INTRAVENOUS

## 2011-12-29 MED ORDER — POTASSIUM CHLORIDE CRYS ER 20 MEQ PO TBCR
40.0000 meq | EXTENDED_RELEASE_TABLET | Freq: Once | ORAL | Status: AC
  Administered 2011-12-29: 40 meq via ORAL
  Filled 2011-12-29: qty 2

## 2011-12-29 MED ORDER — SODIUM CHLORIDE 0.9 % IV SOLN
Freq: Once | INTRAVENOUS | Status: AC
  Administered 2011-12-29: 18:00:00 via INTRAVENOUS

## 2011-12-29 MED ORDER — IOHEXOL 300 MG/ML  SOLN
100.0000 mL | Freq: Once | INTRAMUSCULAR | Status: AC | PRN
  Administered 2011-12-29: 100 mL via INTRAVENOUS

## 2011-12-29 MED ORDER — HYDROCODONE-ACETAMINOPHEN 5-325 MG PO TABS: 1.0000 | ORAL_TABLET | Freq: Four times a day (QID) | ORAL | Status: DC | PRN

## 2011-12-29 NOTE — ED Provider Notes (Signed)
Patient moved to CDU pending completion of diagnostic testing in the evaluation of abdominal pain.  Lab and radiology results reviewed, discussed with Dr Jeraldine Loots and shared with patient.  Trichomoniasis present, but patient is allergic to flagyl.  Discussed with the [facility]--they suggest having the patient follow-up with the STD clinic at the [facility] Department to facilitate desensitization treatment.  Will provide a prescription for a short course of potassium supplementation (patient is taking lasix for hypertension treatment).  Patient should follow-up with her provider at the [facility] clinic.  Jimmye Norman, NP 12/30/11 [phone]

## 2011-12-29 NOTE — ED Provider Notes (Signed)
History     CSN: [id number]  Arrival date & time 12/29/11  1201   First MD Initiated Contact with Patient 12/29/11 1408      Chief Complaint  Patient presents with  . Abdominal Pain    (Consider location/radiation/quality/duration/timing/severity/associated sxs/prior treatment) HPI Comments: This is a 41 year old female, PMH: HTN, MI, emphysema, who presents to the emergency department with chief complaint of abdominal pain. The patient states that the pain began 2 days ago. She has not taken anything for her pain. Her symptoms do not radiate. Her pain as 8/10.  The history is provided by the patient. No language interpreter was used.    Past Medical History  Diagnosis Date  . MI (myocardial infarction)   . Hypertension     History reviewed. No pertinent past surgical history.  History reviewed. No pertinent family history.  History  Substance Use Topics  . Smoking status: Current Every Day Smoker  . Smokeless tobacco: Not on file  . Alcohol Use: No    OB History    Grav Para Term Preterm Abortions TAB SAB Ect Mult Living                  Review of Systems  Constitutional: Negative for fever.  HENT: Negative for rhinorrhea.   Eyes: Negative for visual disturbance.  Respiratory: Negative for chest tightness and shortness of breath.   Cardiovascular: Negative for chest pain.  Gastrointestinal: Positive for vomiting and abdominal pain. Negative for nausea and diarrhea.  Genitourinary: Positive for vaginal discharge. Negative for dysuria.  Musculoskeletal: Negative for back pain.  Skin: Negative for rash.  Neurological: Negative for dizziness, weakness and numbness.  Psychiatric/Behavioral: The patient is not nervous/anxious.   All other systems reviewed and are negative.    Allergies  Flagyl  Home Medications   Current Outpatient Rx  Name Route Sig Dispense Refill  . AMLODIPINE BESYLATE 5 MG PO TABS Oral Take 5 mg by mouth daily.    Marland Kitchen  LISINOPRIL-HYDROCHLOROTHIAZIDE 20-25 MG PO TABS Oral Take 1 tablet by mouth daily.    Marland Kitchen METOPROLOL TARTRATE 25 MG PO TABS Oral Take 25 mg by mouth 2 (two) times daily.      BP 155/81  Pulse 76  Temp 98.7 F (37.1 C) (Oral)  Resp 16  SpO2 98%  LMP 12/25/2011  Physical Exam  Nursing note and vitals reviewed. Constitutional: She appears well-developed and well-nourished.  HENT:  Head: Normocephalic and atraumatic.  Eyes: Conjunctivae normal and EOM are normal. Pupils are equal, round, and reactive to light.  Neck: Normal range of motion. Neck supple.  Cardiovascular: Normal rate, regular rhythm and normal heart sounds.   Pulmonary/Chest: Effort normal and breath sounds normal.  Abdominal: Soft. Bowel sounds are normal. She exhibits no distension and no mass. There is tenderness. There is no rebound and no guarding.  Genitourinary: There is no rash, tenderness, lesion or injury on the right labia. There is no rash, tenderness, lesion or injury on the left labia. Cervix exhibits discharge. Cervix exhibits no motion tenderness and no friability. No erythema, tenderness or bleeding around the vagina. No foreign body around the vagina. No signs of injury around the vagina. Vaginal discharge found.  Musculoskeletal: Normal range of motion.    ED Course  Procedures (including critical care time)  Labs Reviewed  URINALYSIS, ROUTINE W REFLEX MICROSCOPIC - Abnormal; Notable for the following:    Color, Urine AMBER (*)  BIOCHEMICALS MAY BE AFFECTED BY COLOR   APPearance CLOUDY (*)  Specific Gravity, Urine 1.031 (*)     Bilirubin Urine SMALL (*)     Ketones, ur 15 (*)     Protein, ur 30 (*)     Leukocytes, UA SMALL (*)     All other components within normal limits  BASIC METABOLIC PANEL - Abnormal; Notable for the following:    Potassium 3.1 (*)     Glucose, Bld 103 (*)     GFR calc non Af Amer 87 (*)     All other components within normal limits  CBC - Abnormal; Notable for the  following:    Hemoglobin 10.3 (*)     HCT 32.4 (*)     MCV 77.0 (*)     MCH 24.5 (*)     RDW 16.7 (*)     All other components within normal limits  URINE MICROSCOPIC-ADD ON - Abnormal; Notable for the following:    Bacteria, UA FEW (*)     Crystals CA OXALATE CRYSTALS (*)     All other components within normal limits  POCT PREGNANCY, URINE  GC/CHLAMYDIA PROBE AMP, GENITAL  WET PREP, GENITAL   Results for orders placed during the hospital encounter of 12/29/11  URINALYSIS, ROUTINE W REFLEX MICROSCOPIC      Component Value Range   Color, Urine AMBER (*) YELLOW   APPearance CLOUDY (*) CLEAR   Specific Gravity, Urine 1.031 (*) 1.005 - 1.030   pH 7.0  5.0 - 8.0   Glucose, UA NEGATIVE  NEGATIVE mg/dL   Hgb urine dipstick NEGATIVE  NEGATIVE   Bilirubin Urine SMALL (*) NEGATIVE   Ketones, ur 15 (*) NEGATIVE mg/dL   Protein, ur 30 (*) NEGATIVE mg/dL   Urobilinogen, UA 1.0  0.0 - 1.0 mg/dL   Nitrite NEGATIVE  NEGATIVE   Leukocytes, UA SMALL (*) NEGATIVE  BASIC METABOLIC PANEL      Component Value Range   Sodium 140  135 - 145 mEq/L   Potassium 3.1 (*) 3.5 - 5.1 mEq/L   Chloride 105  96 - 112 mEq/L   CO2 24  19 - 32 mEq/L   Glucose, Bld 103 (*) 70 - 99 mg/dL   BUN 9  6 - 23 mg/dL   Creatinine, Ser 1.61  0.50 - 1.10 mg/dL   Calcium 9.4  8.4 - 09.6 mg/dL   GFR calc non Af Amer 87 (*) >90 mL/min   GFR calc Af Amer >90  >90 mL/min  CBC      Component Value Range   WBC 6.9  4.0 - 10.5 K/uL   RBC 4.21  3.87 - 5.11 MIL/uL   Hemoglobin 10.3 (*) 12.0 - 15.0 g/dL   HCT 04.5 (*) 40.9 - 81.1 %   MCV 77.0 (*) 78.0 - 100.0 fL   MCH 24.5 (*) 26.0 - 34.0 pg   MCHC 31.8  30.0 - 36.0 g/dL   RDW 91.4 (*) 78.2 - 95.6 %   Platelets 385  150 - 400 K/uL  POCT PREGNANCY, URINE      Component Value Range   Preg Test, Ur NEGATIVE  NEGATIVE  URINE MICROSCOPIC-ADD ON      Component Value Range   Squamous Epithelial / LPF RARE  RARE   WBC, UA 0-2  <3 WBC/hpf   RBC / HPF 0-2  <3 RBC/hpf    Bacteria, UA FEW (*) RARE   Crystals CA OXALATE CRYSTALS (*) NEGATIVE   Urine-Other TRICHOMONAS PRESENT    WET PREP, GENITAL  Component Value Range   Yeast Wet Prep HPF POC NONE SEEN  NONE SEEN   Trich, Wet Prep FEW (*) NONE SEEN   Clue Cells Wet Prep HPF POC NONE SEEN  NONE SEEN   WBC, Wet Prep HPF POC FEW (*) NONE SEEN   Ct Abdomen Pelvis Wo Contrast  12/29/2011  *RADIOLOGY REPORT*  Clinical Data: Generalized abdominal pain and cramping for 2 days, some vomiting  CT ABDOMEN AND PELVIS WITHOUT CONTRAST  Technique:  Multidetector CT imaging of the abdomen and pelvis was performed following the standard protocol without intravenous contrast.  Comparison: CT abdomen pelvis of 03/15/2006  Findings: The lung bases are clear.  The liver is unremarkable in the unenhanced state.  No calcified gallstones are seen.  The pancreas is stable in size and the pancreatic duct is not appear to be dilated.  The adrenal glands and spleen are unremarkable.  The stomach is decompressed.  A low attenuation structure in the anterolateral right mid kidney has increased in size and is most consistent with a cyst.  However in view of increased size, CT with contrast may be help to assess further versus MRI.  Small left renal cyst is stable. No renal calculi are seen.  No hydronephrosis is noted.  The abdominal aorta is normal in caliber.  No adenopathy is seen.  The distal ureters are normal in caliber and no distal ureteral calculus is noted.  The uterus is normal in size.  No adnexal lesion is seen.  No free fluid is noted.  However, there is higher attenuation anteriorly within the urinary bladder.  Although the urinary bladder is not distended, this is worrisome for tumor. Blood clot would most likely layer dependently within urinary bladder. If there is any blood in the urine, then cystoscopy may be warranted.  A few scattered colonic diverticula are present.  The terminal ileum and appendix are unremarkable. No bony  abnormality is seen.  IMPRESSION:  1.  No renal calculi.  No hydronephrosis. 2.  Higher attenuation along the anterior aspect of the urinary bladder of uncertain significance.  If there is blood in the urine, then cystoscopy may be helpful to exclude a urinary bladder lesion. 3.  Increase in size of a low attenuation area within right kidney most consistent with cyst.  Consider CT with IV contrast to assess further.   Original Report Authenticated By: Juline Patch, M.D.        No diagnosis found.    MDM   41 year old female with abdominal pain. Concern for kidney stone, based on calcium oxalate crystals found in urine. Trichomonas was found in urine. Pain management to the ED includes 4 mg morphine and Zofran.  ~6:00 Patient given 1 of dilaudid.  Patient states that her pain is much better.  6:54 PM I am going to transfer the patient to CDU. I have signed out this patient to Felicie Morn, NP, who will continue care at this time. Disposition will be to home pending negative CT abdomen/pelvis, including treatment for her Trichomonas infection.       Roxy Horseman, PA-C 01/02/12 [phone]

## 2011-12-29 NOTE — ED Notes (Signed)
Report received from [place], [place].  Assumed care of the patient at this time.

## 2011-12-29 NOTE — ED Notes (Signed)
Family at bedside. 

## 2011-12-29 NOTE — ED Notes (Signed)
Pt reports generalized abd pain and cramping for the past 2 days. Reports some vomiting yesterday, denies any urinary symptoms. Denies any abd tenderness.

## 2011-12-30 LAB — GC/CHLAMYDIA PROBE AMP, GENITAL: GC Probe Amp, Genital: NEGATIVE

## 2011-12-30 NOTE — ED Provider Notes (Signed)
I was available during the later portion of this patient's CDU course for consult.   Gerhard Munch, MD 12/30/11 [phone]

## 2012-01-04 ENCOUNTER — Other Ambulatory Visit: Payer: Self-pay | Admitting: Internal Medicine

## 2012-01-04 DIAGNOSIS — Z1231 Encounter for screening mammogram for malignant neoplasm of breast: Secondary | ICD-10-CM

## 2012-01-04 NOTE — ED Provider Notes (Signed)
Medical screening examination/treatment/procedure(s) were performed by non-physician practitioner and as supervising physician I was immediately available for consultation/collaboration.  Juliet Rude. Rubin Payor, MD 01/04/12 [phone]

## 2012-02-11 ENCOUNTER — Ambulatory Visit: Payer: No Typology Code available for payment source

## 2012-03-09 HISTORY — PX: OTHER SURGICAL HISTORY: SHX169

## 2012-03-28 ENCOUNTER — Emergency Department (HOSPITAL_COMMUNITY): Payer: Medicaid Other

## 2012-03-28 ENCOUNTER — Inpatient Hospital Stay (HOSPITAL_COMMUNITY)
Admission: EM | Admit: 2012-03-28 | Discharge: 2012-04-06 | DRG: 964 | Disposition: A | Discharge status: Home / Self Care | Payer: Medicaid Other | Attending: General Surgery | Admitting: General Surgery

## 2012-03-28 ENCOUNTER — Other Ambulatory Visit: Payer: Self-pay

## 2012-03-28 ENCOUNTER — Encounter (HOSPITAL_COMMUNITY): Payer: Self-pay | Admitting: *Deleted

## 2012-03-28 DIAGNOSIS — T07XXXA Unspecified multiple injuries, initial encounter: Secondary | ICD-10-CM

## 2012-03-28 DIAGNOSIS — D62 Acute posthemorrhagic anemia: Secondary | ICD-10-CM | POA: Diagnosis present

## 2012-03-28 DIAGNOSIS — S272XXA Traumatic hemopneumothorax, initial encounter: Secondary | ICD-10-CM | POA: Diagnosis present

## 2012-03-28 DIAGNOSIS — S2190XA Unspecified open wound of unspecified part of thorax, initial encounter: Principal | ICD-10-CM | POA: Diagnosis present

## 2012-03-28 DIAGNOSIS — S21009A Unspecified open wound of unspecified breast, initial encounter: Secondary | ICD-10-CM | POA: Diagnosis present

## 2012-03-28 DIAGNOSIS — S271XXA Traumatic hemothorax, initial encounter: Secondary | ICD-10-CM

## 2012-03-28 DIAGNOSIS — S36115A Moderate laceration of liver, initial encounter: Secondary | ICD-10-CM | POA: Diagnosis present

## 2012-03-28 DIAGNOSIS — W3400XA Accidental discharge from unspecified firearms or gun, initial encounter: Secondary | ICD-10-CM

## 2012-03-28 DIAGNOSIS — S36113A Laceration of liver, unspecified degree, initial encounter: Secondary | ICD-10-CM | POA: Diagnosis present

## 2012-03-28 DIAGNOSIS — F3289 Other specified depressive episodes: Secondary | ICD-10-CM | POA: Diagnosis present

## 2012-03-28 DIAGNOSIS — S21039A Puncture wound without foreign body of unspecified breast, initial encounter: Secondary | ICD-10-CM

## 2012-03-28 DIAGNOSIS — I10 Essential (primary) hypertension: Secondary | ICD-10-CM | POA: Diagnosis present

## 2012-03-28 DIAGNOSIS — Z6841 Body Mass Index (BMI) 40.0 and over, adult: Secondary | ICD-10-CM

## 2012-03-28 DIAGNOSIS — F329 Major depressive disorder, single episode, unspecified: Secondary | ICD-10-CM | POA: Diagnosis present

## 2012-03-28 DIAGNOSIS — I252 Old myocardial infarction: Secondary | ICD-10-CM

## 2012-03-28 DIAGNOSIS — I498 Other specified cardiac arrhythmias: Secondary | ICD-10-CM | POA: Diagnosis present

## 2012-03-28 DIAGNOSIS — S21109A Unspecified open wound of unspecified front wall of thorax without penetration into thoracic cavity, initial encounter: Secondary | ICD-10-CM

## 2012-03-28 DIAGNOSIS — E785 Hyperlipidemia, unspecified: Secondary | ICD-10-CM | POA: Diagnosis present

## 2012-03-28 DIAGNOSIS — E669 Obesity, unspecified: Secondary | ICD-10-CM | POA: Diagnosis present

## 2012-03-28 DIAGNOSIS — E611 Iron deficiency: Secondary | ICD-10-CM | POA: Clinically undetermined

## 2012-03-28 DIAGNOSIS — I2510 Atherosclerotic heart disease of native coronary artery without angina pectoris: Secondary | ICD-10-CM | POA: Diagnosis present

## 2012-03-28 HISTORY — DX: Atherosclerotic heart disease of native coronary artery without angina pectoris: I25.10

## 2012-03-28 HISTORY — DX: Depression, unspecified: F32.A

## 2012-03-28 HISTORY — DX: Acute myocardial infarction, unspecified: I21.9

## 2012-03-28 HISTORY — DX: Major depressive disorder, single episode, unspecified: F32.9

## 2012-03-28 LAB — POCT I-STAT, CHEM 8
Calcium, Ion: 1.22 mmol/L (ref 1.12–1.23)
Chloride: 107 mEq/L (ref 96–112)
Glucose, Bld: 113 mg/dL — ABNORMAL HIGH (ref 70–99)
HCT: 32 % — ABNORMAL LOW (ref 36.0–46.0)
Hemoglobin: 10.9 g/dL — ABNORMAL LOW (ref 12.0–15.0)

## 2012-03-28 LAB — PROTIME-INR
INR: 1.05 (ref 0.00–1.49)
INR: 1.09 (ref 0.00–1.49)
Prothrombin Time: 13.6 seconds (ref 11.6–15.2)
Prothrombin Time: 14 seconds (ref 11.6–15.2)

## 2012-03-28 LAB — URINALYSIS, MICROSCOPIC ONLY
Bilirubin Urine: NEGATIVE
Hgb urine dipstick: NEGATIVE
Ketones, ur: NEGATIVE mg/dL
Nitrite: NEGATIVE
Urobilinogen, UA: 1 mg/dL (ref 0.0–1.0)
pH: 6 (ref 5.0–8.0)

## 2012-03-28 LAB — COMPREHENSIVE METABOLIC PANEL
ALT: 78 U/L — ABNORMAL HIGH (ref 0–35)
AST: 84 U/L — ABNORMAL HIGH (ref 0–37)
Albumin: 3.3 g/dL — ABNORMAL LOW (ref 3.5–5.2)
Alkaline Phosphatase: 92 U/L (ref 39–117)
Alkaline Phosphatase: 90 U/L (ref 39–117)
BUN: 15 mg/dL (ref 6–23)
CO2: 21 mEq/L (ref 19–32)
Chloride: 103 mEq/L (ref 96–112)
Chloride: 106 mEq/L (ref 96–112)
Creatinine, Ser: 0.93 mg/dL (ref 0.50–1.10)
GFR calc non Af Amer: 75 mL/min — ABNORMAL LOW (ref >90)
Glucose, Bld: 141 mg/dL — ABNORMAL HIGH (ref 70–99)
Potassium: 3.5 mEq/L (ref 3.5–5.1)
Sodium: 136 mEq/L (ref 135–145)
Total Bilirubin: 0.2 mg/dL — ABNORMAL LOW (ref 0.3–1.2)
Total Bilirubin: 0.2 mg/dL — ABNORMAL LOW (ref 0.3–1.2)
Total Protein: 6.9 g/dL (ref 6.0–8.3)

## 2012-03-28 LAB — TYPE AND SCREEN
Antibody Screen: NEGATIVE
Unit division: 0
Unit division: 0

## 2012-03-28 LAB — CBC
HCT: 31 % — ABNORMAL LOW (ref 36.0–46.0)
HCT: 29 % — ABNORMAL LOW (ref 36.0–46.0)
Hemoglobin: 8.9 g/dL — ABNORMAL LOW (ref 12.0–15.0)
MCH: 21.9 pg — ABNORMAL LOW (ref 26.0–34.0)
MCV: 71.6 fL — ABNORMAL LOW (ref 78.0–100.0)
MCV: 71.6 fL — ABNORMAL LOW (ref 78.0–100.0)
Platelets: 430 10*3/uL — ABNORMAL HIGH (ref 150–400)
RBC: 4.05 MIL/uL (ref 3.87–5.11)
RDW: 17 % — ABNORMAL HIGH (ref 11.5–15.5)
WBC: 16 10*3/uL — ABNORMAL HIGH (ref 4.0–10.5)

## 2012-03-28 LAB — CG4 I-STAT (LACTIC ACID): Lactic Acid, Venous: 1.68 mmol/L (ref 0.5–2.2)

## 2012-03-28 LAB — MRSA PCR SCREENING: MRSA by PCR: NEGATIVE

## 2012-03-28 LAB — HEMOGLOBIN AND HEMATOCRIT, BLOOD: Hemoglobin: 7.9 g/dL — ABNORMAL LOW (ref 12.0–15.0)

## 2012-03-28 MED ORDER — FENTANYL CITRATE 0.05 MG/ML IJ SOLN
INTRAMUSCULAR | Status: AC
  Filled 2012-03-28: qty 4

## 2012-03-28 MED ORDER — NALOXONE HCL 0.4 MG/ML IJ SOLN: 0.4000 mg | INTRAMUSCULAR | Status: DC | PRN

## 2012-03-28 MED ORDER — ONDANSETRON HCL 4 MG/2ML IJ SOLN: 4.0000 mg | Freq: Four times a day (QID) | INTRAMUSCULAR | Status: DC | PRN

## 2012-03-28 MED ORDER — CEFAZOLIN SODIUM-DEXTROSE 2-3 GM-% IV SOLR
2.0000 g | Freq: Once | INTRAVENOUS | Status: AC
  Administered 2012-03-28: 2 g via INTRAVENOUS
  Filled 2012-03-28: qty 50

## 2012-03-28 MED ORDER — SODIUM CHLORIDE 0.9 % IJ SOLN: 9.0000 mL | INTRAMUSCULAR | Status: DC | PRN

## 2012-03-28 MED ORDER — HYDROMORPHONE 0.3 MG/ML IV SOLN
INTRAVENOUS | Status: AC
  Filled 2012-03-28: qty 25

## 2012-03-28 MED ORDER — DIPHENHYDRAMINE HCL 50 MG/ML IJ SOLN: 12.5000 mg | Freq: Four times a day (QID) | INTRAMUSCULAR | Status: DC | PRN

## 2012-03-28 MED ORDER — PANTOPRAZOLE SODIUM 40 MG PO TBEC
40.0000 mg | DELAYED_RELEASE_TABLET | Freq: Every day | ORAL | Status: DC
  Filled 2012-03-28: qty 1

## 2012-03-28 MED ORDER — PNEUMOCOCCAL VAC POLYVALENT 25 MCG/0.5ML IJ INJ
0.5000 mL | INJECTION | INTRAMUSCULAR | Status: AC
  Filled 2012-03-28: qty 0.5

## 2012-03-28 MED ORDER — ONDANSETRON HCL 4 MG PO TABS: 4.0000 mg | ORAL_TABLET | Freq: Four times a day (QID) | ORAL | Status: DC | PRN

## 2012-03-28 MED ORDER — PANTOPRAZOLE SODIUM 40 MG IV SOLR
40.0000 mg | Freq: Every day | INTRAVENOUS | Status: DC
  Administered 2012-03-28: 40 mg via INTRAVENOUS
  Filled 2012-03-28 (×2): qty 40

## 2012-03-28 MED ORDER — INFLUENZA VIRUS VACC SPLIT PF IM SUSP
0.5000 mL | INTRAMUSCULAR | Status: AC
  Filled 2012-03-28: qty 0.5

## 2012-03-28 MED ORDER — CHLORHEXIDINE GLUCONATE 0.12 % MT SOLN
15.0000 mL | Freq: Two times a day (BID) | OROMUCOSAL | Status: DC
  Administered 2012-03-28 – 2012-03-29 (×4): 15 mL via OROMUCOSAL
  Filled 2012-03-28 (×4): qty 15

## 2012-03-28 MED ORDER — FENTANYL CITRATE 0.05 MG/ML IJ SOLN
INTRAMUSCULAR | Status: AC | PRN
  Administered 2012-03-28: 150 ug via INTRAVENOUS

## 2012-03-28 MED ORDER — DEXTROSE IN LACTATED RINGERS 5 % IV SOLN
INTRAVENOUS | Status: DC
  Administered 2012-03-28: 100 mL via INTRAVENOUS
  Administered 2012-03-28 – 2012-03-29 (×2): via INTRAVENOUS

## 2012-03-28 MED ORDER — BIOTENE DRY MOUTH MT LIQD
15.0000 mL | Freq: Two times a day (BID) | OROMUCOSAL | Status: DC
  Administered 2012-03-28 – 2012-03-29 (×4): 15 mL via OROMUCOSAL

## 2012-03-28 MED ORDER — LORAZEPAM 2 MG/ML IJ SOLN
INTRAMUSCULAR | Status: AC
  Filled 2012-03-28: qty 1

## 2012-03-28 MED ORDER — DIPHENHYDRAMINE HCL 12.5 MG/5ML PO ELIX
12.5000 mg | ORAL_SOLUTION | Freq: Four times a day (QID) | ORAL | Status: DC | PRN
  Filled 2012-03-28: qty 5

## 2012-03-28 MED ORDER — MORPHINE SULFATE 2 MG/ML IJ SOLN
2.0000 mg | Freq: Once | INTRAMUSCULAR | Status: AC
  Administered 2012-03-28: 2 mg via INTRAVENOUS
  Filled 2012-03-28: qty 1

## 2012-03-28 MED ORDER — IOHEXOL 300 MG/ML  SOLN
100.0000 mL | Freq: Once | INTRAMUSCULAR | Status: AC | PRN
  Administered 2012-03-28: 100 mL via INTRAVENOUS

## 2012-03-28 MED ORDER — HYDROMORPHONE 0.3 MG/ML IV SOLN
INTRAVENOUS | Status: DC
  Administered 2012-03-28: 0.648 mg via INTRAVENOUS
  Administered 2012-03-28 (×2): via INTRAVENOUS
  Administered 2012-03-28: 2.7 mg via INTRAVENOUS
  Administered 2012-03-28: 2.1 mg via INTRAVENOUS
  Administered 2012-03-28: 1.8 mg via INTRAVENOUS
  Administered 2012-03-29: 2.73 mg via INTRAVENOUS
  Administered 2012-03-29: 0.6 mg via INTRAVENOUS
  Administered 2012-03-29: 1.5 mg via INTRAVENOUS
  Filled 2012-03-28: qty 25

## 2012-03-28 MED ORDER — ONDANSETRON HCL 4 MG/2ML IJ SOLN
4.0000 mg | Freq: Once | INTRAMUSCULAR | Status: AC
  Administered 2012-03-28: 4 mg via INTRAVENOUS
  Filled 2012-03-28: qty 2

## 2012-03-28 MED ORDER — TETANUS-DIPHTH-ACELL PERTUSSIS 5-2.5-18.5 LF-MCG/0.5 IM SUSP
0.5000 mL | Freq: Once | INTRAMUSCULAR | Status: AC
  Administered 2012-03-28: 0.5 mL via INTRAMUSCULAR
  Filled 2012-03-28: qty 0.5

## 2012-03-28 NOTE — ED Notes (Signed)
Patient in route to CT scanner

## 2012-03-28 NOTE — ED Notes (Signed)
RN attempting second IV site at this time.
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
## 2012-03-28 NOTE — Plan of Care (Signed)
Problem: Phase I Progression Outcomes Goal: Voiding-avoid urinary catheter unless indicated Outcome: Not Met (add Reason) Pt admitted to ICU with Foley
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
## 2012-03-28 NOTE — Progress Notes (Signed)
UR completed 

## 2012-03-28 NOTE — ED Notes (Signed)
Pt request NRB mask, states it helps her breath better and relax.
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
## 2012-03-29 ENCOUNTER — Inpatient Hospital Stay (HOSPITAL_COMMUNITY): Payer: Medicaid Other

## 2012-03-29 DIAGNOSIS — S21339A Puncture wound without foreign body of unspecified front wall of thorax with penetration into thoracic cavity, initial encounter: Secondary | ICD-10-CM

## 2012-03-29 DIAGNOSIS — S36115A Moderate laceration of liver, initial encounter: Secondary | ICD-10-CM | POA: Diagnosis present

## 2012-03-29 DIAGNOSIS — I10 Essential (primary) hypertension: Secondary | ICD-10-CM | POA: Insufficient documentation

## 2012-03-29 DIAGNOSIS — S21039A Puncture wound without foreign body of unspecified breast, initial encounter: Secondary | ICD-10-CM

## 2012-03-29 DIAGNOSIS — I2510 Atherosclerotic heart disease of native coronary artery without angina pectoris: Secondary | ICD-10-CM | POA: Insufficient documentation

## 2012-03-29 DIAGNOSIS — D62 Acute posthemorrhagic anemia: Secondary | ICD-10-CM

## 2012-03-29 DIAGNOSIS — E669 Obesity, unspecified: Secondary | ICD-10-CM | POA: Insufficient documentation

## 2012-03-29 DIAGNOSIS — E785 Hyperlipidemia, unspecified: Secondary | ICD-10-CM | POA: Insufficient documentation

## 2012-03-29 DIAGNOSIS — S272XXA Traumatic hemopneumothorax, initial encounter: Secondary | ICD-10-CM | POA: Diagnosis present

## 2012-03-29 LAB — COMPREHENSIVE METABOLIC PANEL
ALT: 124 U/L — ABNORMAL HIGH (ref 0–35)
Albumin: 2.8 g/dL — ABNORMAL LOW (ref 3.5–5.2)
Alkaline Phosphatase: 72 U/L (ref 39–117)
BUN: 11 mg/dL (ref 6–23)
Chloride: 101 mEq/L (ref 96–112)
GFR calc Af Amer: >90 mL/min (ref >90)
Glucose, Bld: 109 mg/dL — ABNORMAL HIGH (ref 70–99)
Potassium: 3.8 mEq/L (ref 3.5–5.1)
Sodium: 133 mEq/L — ABNORMAL LOW (ref 135–145)
Total Bilirubin: 0.3 mg/dL (ref 0.3–1.2)

## 2012-03-29 LAB — HEMOGLOBIN AND HEMATOCRIT, BLOOD: Hemoglobin: 7.1 g/dL — ABNORMAL LOW (ref 12.0–15.0)

## 2012-03-29 LAB — CBC
HCT: 22.9 % — ABNORMAL LOW (ref 36.0–46.0)
Hemoglobin: 7 g/dL — ABNORMAL LOW (ref 12.0–15.0)
WBC: 9.2 10*3/uL (ref 4.0–10.5)

## 2012-03-29 LAB — TROPONIN I: Troponin I: <0.3 ng/mL (ref <0.30)

## 2012-03-29 LAB — CDS SEROLOGY

## 2012-03-29 MED ORDER — POTASSIUM CHLORIDE CRYS ER 10 MEQ PO TBCR
10.0000 meq | EXTENDED_RELEASE_TABLET | Freq: Every day | ORAL | Status: DC
  Administered 2012-03-29 – 2012-04-06 (×9): 10 meq via ORAL
  Filled 2012-03-29 (×10): qty 1

## 2012-03-29 MED ORDER — OXYCODONE HCL 5 MG PO TABS
5.0000 mg | ORAL_TABLET | ORAL | Status: DC | PRN
  Administered 2012-03-29 (×2): 10 mg via ORAL
  Administered 2012-03-29: 5 mg via ORAL
  Administered 2012-03-30 (×3): 15 mg via ORAL
  Administered 2012-03-30: 10 mg via ORAL
  Administered 2012-03-31 – 2012-04-03 (×19): 15 mg via ORAL
  Filled 2012-03-29 (×14): qty 3
  Filled 2012-03-29: qty 10
  Filled 2012-03-29 (×6): qty 3
  Filled 2012-03-29: qty 1
  Filled 2012-03-29: qty 2
  Filled 2012-03-29: qty 3
  Filled 2012-03-29: qty 2
  Filled 2012-03-29: qty 3

## 2012-03-29 MED ORDER — FUROSEMIDE 20 MG PO TABS
20.0000 mg | ORAL_TABLET | Freq: Every day | ORAL | Status: DC
  Administered 2012-03-29 – 2012-04-06 (×9): 20 mg via ORAL
  Filled 2012-03-29 (×11): qty 1

## 2012-03-29 MED ORDER — HYDROMORPHONE HCL PF 1 MG/ML IJ SOLN
0.5000 mg | INTRAMUSCULAR | Status: DC | PRN
  Administered 2012-03-29 – 2012-04-05 (×11): 0.5 mg via INTRAVENOUS
  Filled 2012-03-29 (×11): qty 1

## 2012-03-29 MED ORDER — SIMVASTATIN 10 MG PO TABS
10.0000 mg | ORAL_TABLET | Freq: Every day | ORAL | Status: DC
  Administered 2012-03-29 – 2012-04-05 (×8): 10 mg via ORAL
  Filled 2012-03-29 (×9): qty 1

## 2012-03-29 NOTE — Progress Notes (Signed)
Patient examined and I agree with the assessment and plan I spoke in detail with her regarding the real possibility she may need blood.  She will agree if it is absolutely necessary.  I told her the risks associated with blood transfusion.  Will keep in ICU until F/U Hb is back.  Hopefully reaching the nadir. Violeta Gelinas, MD, MPH, FACS Pager: [phone]  03/29/2012 9:22 AM
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
## 2012-03-29 NOTE — Sedation Documentation (Signed)
Wasted Dilaudid PCA 0.3mg /ml with 7ml remaining in syringe in the sink. Michail Jewels RN as witness.

## 2012-03-30 ENCOUNTER — Inpatient Hospital Stay (HOSPITAL_COMMUNITY): Payer: Medicaid Other

## 2012-03-30 DIAGNOSIS — I10 Essential (primary) hypertension: Secondary | ICD-10-CM

## 2012-03-30 LAB — HEMOGLOBIN AND HEMATOCRIT, BLOOD: HCT: 23.2 % — ABNORMAL LOW (ref 36.0–46.0)

## 2012-03-30 NOTE — Progress Notes (Signed)
Transferred to 6N   Room 31 via wheelchair and monitor.  Walked into room and placed SCD's back on patient.  RN made aware that patient was in room.
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
## 2012-03-31 ENCOUNTER — Inpatient Hospital Stay (HOSPITAL_COMMUNITY): Payer: Medicaid Other

## 2012-03-31 LAB — BASIC METABOLIC PANEL
BUN: 8 mg/dL (ref 6–23)
Chloride: 100 mEq/L (ref 96–112)
GFR calc Af Amer: >90 mL/min (ref >90)
GFR calc non Af Amer: >90 mL/min (ref >90)
Potassium: 3.4 mEq/L — ABNORMAL LOW (ref 3.5–5.1)
Sodium: 135 mEq/L (ref 135–145)

## 2012-03-31 LAB — MAGNESIUM: Magnesium: 1.8 mg/dL (ref 1.5–2.5)

## 2012-03-31 LAB — CBC WITH DIFFERENTIAL/PLATELET
Basophils Absolute: 0 10*3/uL (ref 0.0–0.1)
Eosinophils Relative: 2 % (ref 0–5)
Lymphocytes Relative: 18 % (ref 12–46)
Monocytes Relative: 9 % (ref 3–12)
Neutrophils Relative %: 71 % (ref 43–77)
Platelets: 279 10*3/uL (ref 150–400)
RBC: 3.37 MIL/uL — ABNORMAL LOW (ref 3.87–5.11)
RDW: 17.6 % — ABNORMAL HIGH (ref 11.5–15.5)
WBC: 9.7 10*3/uL (ref 4.0–10.5)

## 2012-03-31 MED ORDER — AMLODIPINE BESYLATE 5 MG PO TABS
5.0000 mg | ORAL_TABLET | Freq: Every day | ORAL | Status: DC
  Administered 2012-03-31 – 2012-04-06 (×7): 5 mg via ORAL
  Filled 2012-03-31 (×7): qty 1

## 2012-03-31 NOTE — Progress Notes (Signed)
Called by primary RN to eval EKG strip.  Pt with 6 beats of narrow complex tach. Per primary RN VSS & pt sleeping during episode.  Recommended notifying MD & asking for BMP if not scheduled already.

## 2012-03-31 NOTE — Progress Notes (Signed)
UR completed 
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
## 2012-03-31 NOTE — Progress Notes (Signed)
Notified by Monitor tech on 4N that pt had an episode of Vtach (6 beats). Checked on pt, pt appeared to be sleeping comfortably. Consulted rapid response regarding that strip. Per April (Rapid Response RN) this was not Vtach but an increase in the pt's rate. Currently pt NSR 69 bpm. Noted rapid response's evaluation of strip in pt chart.

## 2012-03-31 NOTE — Progress Notes (Signed)
Chest tube does appear as though it has been pulled back a bit.  Needs to have dressing taken down and tube secured because it should not come out yet--too much drainage and will  Put back on suction.  This patient has been seen and I agree with the findings and treatment plan.  Marta Lamas. Gae Bon, MD, FACS [phone] (pager) [phone] (direct pager) Trauma Surgeon

## 2012-03-31 NOTE — Progress Notes (Signed)
Notified Dr. Lindie Spruce of pt's 6 beat run of Vtach during the night. Received orders for BMP and Magnesium to be drawn.
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
## 2012-04-01 ENCOUNTER — Inpatient Hospital Stay (HOSPITAL_COMMUNITY): Payer: Medicaid Other

## 2012-04-01 LAB — CBC WITH DIFFERENTIAL/PLATELET
Eosinophils Relative: 2 % (ref 0–5)
Lymphocytes Relative: 12 % (ref 12–46)
MCH: 21.7 pg — ABNORMAL LOW (ref 26.0–34.0)
Monocytes Absolute: 0.9 10*3/uL (ref 0.1–1.0)
Neutrophils Relative %: 76 % (ref 43–77)
Platelets: 281 10*3/uL (ref 150–400)
RBC: 3.23 MIL/uL — ABNORMAL LOW (ref 3.87–5.11)
WBC: 9.1 10*3/uL (ref 4.0–10.5)

## 2012-04-01 NOTE — Progress Notes (Signed)
OT Cancellation Note and Discharge  Patient Details Name: [name] MRN: [id number] DOB: 1971/02/28   Cancelled Treatment:     Pt at a Supervision level with PT yesterday when up and about and will have Supervision at D/C. Pt recalls the purse lipped breathing that PT taught her. Pt reports that she has been getting up to Bathroom with the only help needed is due to the chest tube. She states that she feels she will not have any issues with BADLs as at home. No OT needs identified, will sign off.  Evette Georges [phone] 04/01/2012, 11:32 AM

## 2012-04-01 NOTE — Progress Notes (Signed)
No output from chest tube since 0700 Thursday 03/31/12
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
## 2012-04-02 ENCOUNTER — Inpatient Hospital Stay (HOSPITAL_COMMUNITY): Payer: Medicaid Other

## 2012-04-02 MED ORDER — ALBUTEROL SULFATE HFA 108 (90 BASE) MCG/ACT IN AERS
2.0000 | INHALATION_SPRAY | Freq: Four times a day (QID) | RESPIRATORY_TRACT | Status: DC | PRN
  Filled 2012-04-02: qty 6.7

## 2012-04-02 MED ORDER — IPRATROPIUM BROMIDE HFA 17 MCG/ACT IN AERS
2.0000 | INHALATION_SPRAY | RESPIRATORY_TRACT | Status: DC
  Administered 2012-04-02 – 2012-04-03 (×5): 2 via RESPIRATORY_TRACT
  Filled 2012-04-02: qty 12.9
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
## 2012-04-03 DIAGNOSIS — R0602 Shortness of breath: Secondary | ICD-10-CM

## 2012-04-03 LAB — CBC
HCT: 21.9 % — ABNORMAL LOW (ref 36.0–46.0)
Hemoglobin: 6.8 g/dL — CL (ref 12.0–15.0)
MCH: 21.6 pg — ABNORMAL LOW (ref 26.0–34.0)
MCHC: 31.1 g/dL (ref 30.0–36.0)
MCV: 69.5 fL — ABNORMAL LOW (ref 78.0–100.0)

## 2012-04-03 MED ORDER — ALBUTEROL SULFATE HFA 108 (90 BASE) MCG/ACT IN AERS
2.0000 | INHALATION_SPRAY | Freq: Three times a day (TID) | RESPIRATORY_TRACT | Status: DC
  Administered 2012-04-03 – 2012-04-04 (×6): 2 via RESPIRATORY_TRACT
  Filled 2012-04-03: qty 6.7

## 2012-04-03 NOTE — Progress Notes (Signed)
Patient ambulated X1 on unit. Patient in no apparent distress, tolerated well.  Will continue to monitor patient

## 2012-04-03 NOTE — Progress Notes (Signed)
Dr. Dwain Sarna notified of patient critical Hgb 6.8.  Awaiting orders.  Will continue to monitor patient
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
## 2012-04-04 ENCOUNTER — Encounter (HOSPITAL_COMMUNITY): Payer: Self-pay | Admitting: Radiology

## 2012-04-04 ENCOUNTER — Inpatient Hospital Stay (HOSPITAL_COMMUNITY): Payer: Medicaid Other

## 2012-04-04 DIAGNOSIS — M795 Residual foreign body in soft tissue: Secondary | ICD-10-CM

## 2012-04-04 LAB — BASIC METABOLIC PANEL
CO2: 23 mEq/L (ref 19–32)
Calcium: 8.8 mg/dL (ref 8.4–10.5)
Creatinine, Ser: 0.66 mg/dL (ref 0.50–1.10)
GFR calc non Af Amer: >90 mL/min (ref >90)

## 2012-04-04 LAB — CBC
MCH: 21.1 pg — ABNORMAL LOW (ref 26.0–34.0)
MCHC: 30.4 g/dL (ref 30.0–36.0)
MCV: 69.3 fL — ABNORMAL LOW (ref 78.0–100.0)
Platelets: 383 10*3/uL (ref 150–400)
RBC: 3.13 MIL/uL — ABNORMAL LOW (ref 3.87–5.11)

## 2012-04-04 LAB — IRON AND TIBC

## 2012-04-04 LAB — MAGNESIUM: Magnesium: 1.9 mg/dL (ref 1.5–2.5)

## 2012-04-04 MED ORDER — BACITRACIN-NEOMYCIN-POLYMYXIN 400-5-5000 EX OINT
TOPICAL_OINTMENT | CUTANEOUS | Status: AC
  Administered 2012-04-04: 1
  Filled 2012-04-04: qty 1

## 2012-04-04 MED ORDER — IOHEXOL 350 MG/ML SOLN
100.0000 mL | Freq: Once | INTRAVENOUS | Status: AC | PRN
  Administered 2012-04-04: 100 mL via INTRAVENOUS

## 2012-04-04 MED ORDER — POLYETHYLENE GLYCOL 3350 17 G PO PACK
17.0000 g | PACK | Freq: Every day | ORAL | Status: DC
  Administered 2012-04-04 – 2012-04-06 (×2): 17 g via ORAL
  Filled 2012-04-04 (×4): qty 1

## 2012-04-04 MED ORDER — OXYCODONE HCL 5 MG PO TABS
10.0000 mg | ORAL_TABLET | ORAL | Status: DC | PRN
  Administered 2012-04-04 – 2012-04-06 (×10): 20 mg via ORAL
  Filled 2012-04-04 (×10): qty 4

## 2012-04-04 MED ORDER — FERROUS SULFATE 325 (65 FE) MG PO TABS
325.0000 mg | ORAL_TABLET | Freq: Three times a day (TID) | ORAL | Status: DC
  Administered 2012-04-04 – 2012-04-06 (×5): 325 mg via ORAL
  Filled 2012-04-04 (×8): qty 1

## 2012-04-04 MED ORDER — DOCUSATE SODIUM 100 MG PO CAPS
100.0000 mg | ORAL_CAPSULE | Freq: Two times a day (BID) | ORAL | Status: DC
  Administered 2012-04-04 – 2012-04-06 (×5): 100 mg via ORAL
  Filled 2012-04-04 (×5): qty 1
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
## 2012-04-04 NOTE — Progress Notes (Signed)
This patient has been seen and I agree with the findings and treatment plan.  Kienan Doublin O. Britley Gashi, III, MD, FACS [phone] (pager) [phone] (direct pager) Trauma Surgeon  

## 2012-04-04 NOTE — Progress Notes (Signed)
Pt Hg level is 6.6. Dr Donell Beers notified.Pt had couple of extreme Bradycardic episode ranges from 35/36 beats in a matter of secs.Pt asymptomatic, not in distress.Md aware. Bmet and magnesium level ordered.

## 2012-04-05 LAB — CBC
MCH: 22.5 pg — ABNORMAL LOW (ref 26.0–34.0)
MCHC: 31.1 g/dL (ref 30.0–36.0)
Platelets: 462 10*3/uL — ABNORMAL HIGH (ref 150–400)

## 2012-04-05 LAB — TYPE AND SCREEN: Unit division: 0

## 2012-04-05 NOTE — Progress Notes (Signed)
Improving Hope for D/C tomorrow if SOB improves and Hb stable CT angio - no PE Patient examined and I agree with the assessment and plan  Violeta Gelinas, MD, MPH, FACS Pager: [phone]  04/05/2012 9:58 AM
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
## 2012-04-05 NOTE — Progress Notes (Addendum)
Pt with questions about how to get a PCP.  She has Medicaid coverage but says she doesn't know who her Medicaid worker is.  Called [place] [facility].  The patient's Medicaid worker is [name].  I wrote her name down and her direct number and gave it to the patient.  The DSS worker I spoke with also addressed an envelope while I was talking to her and put a list of PCPs that are network MD's for [place] Into the mail for the patient. Explained the process to the patient that she chooses a MD from the list and then calls to see if they are accepting new patients.  When she finds one that accepts new pt's she should make the appointment and then notify [name] of the MD she has chosen. Pt stated understanding of this process.

## 2012-04-06 DIAGNOSIS — E611 Iron deficiency: Secondary | ICD-10-CM | POA: Clinically undetermined

## 2012-04-06 LAB — CBC
Platelets: 462 10*3/uL — ABNORMAL HIGH (ref 150–400)
RDW: 18.7 % — ABNORMAL HIGH (ref 11.5–15.5)
WBC: 13 10*3/uL — ABNORMAL HIGH (ref 4.0–10.5)

## 2012-04-06 MED ORDER — BACITRACIN-NEOMYCIN-POLYMYXIN 400-5-5000 EX OINT
TOPICAL_OINTMENT | CUTANEOUS | Status: AC
  Filled 2012-04-06: qty 4

## 2012-04-06 MED ORDER — OXYCODONE-ACETAMINOPHEN 10-325 MG PO TABS: 1.0000 | ORAL_TABLET | ORAL | Status: DC | PRN

## 2012-04-06 MED ORDER — FERROUS SULFATE 325 (65 FE) MG PO TABS: 325.0000 mg | ORAL_TABLET | Freq: Three times a day (TID) | ORAL | Status: DC
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
## 2012-04-06 NOTE — Progress Notes (Signed)
Discharge instructions gone over with patient. Home medications gone over. Prescription given. Diet, activity, and incisional care gone over. Follow up appointment has been made. Signs and symptoms of infection and what to do have been gone over. Daily weights and what to do when patient has a weight gain or loss of 3-5 pounds overnight discussed. Patient verbalized understanding of instructions. My chart gone over also.
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
## 2012-04-08 ENCOUNTER — Encounter (HOSPITAL_COMMUNITY): Payer: Self-pay | Admitting: Cardiology

## 2012-04-11 ENCOUNTER — Telehealth (HOSPITAL_COMMUNITY): Payer: Self-pay | Admitting: Emergency Medicine

## 2012-04-11 NOTE — Telephone Encounter (Signed)
[name]'s aunt called to say pt was having trouble breathing, especially at night, and didn't think she could wait until her appt on Friday. Since she doesn't have a PCP I recommended evaluation at an UC or the ED. The aunt plans on bringing her to the ED this evening.

## 2012-04-15 ENCOUNTER — Telehealth (HOSPITAL_COMMUNITY): Payer: Self-pay | Admitting: Emergency Medicine

## 2012-04-15 ENCOUNTER — Telehealth (INDEPENDENT_AMBULATORY_CARE_PROVIDER_SITE_OTHER): Payer: Self-pay | Admitting: General Surgery

## 2012-04-15 ENCOUNTER — Encounter (INDEPENDENT_AMBULATORY_CARE_PROVIDER_SITE_OTHER): Payer: Self-pay
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
## 2012-04-15 NOTE — Telephone Encounter (Signed)
She said she was doing well so I told her she could come in this afternoon for the MA to remove her sutures/staples.

## 2012-04-18 ENCOUNTER — Ambulatory Visit (INDEPENDENT_AMBULATORY_CARE_PROVIDER_SITE_OTHER): Payer: Medicaid Other | Admitting: General Surgery

## 2012-04-18 ENCOUNTER — Encounter (INDEPENDENT_AMBULATORY_CARE_PROVIDER_SITE_OTHER): Payer: Self-pay | Admitting: General Surgery

## 2012-04-18 VITALS — BP 136/80 | HR 91 | Temp 97.8°F | Ht 64.0 in | Wt 290.2 lb

## 2012-04-18 DIAGNOSIS — Z4802 Encounter for removal of sutures: Secondary | ICD-10-CM
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
## 2012-05-10 ENCOUNTER — Other Ambulatory Visit (HOSPITAL_COMMUNITY): Payer: Self-pay | Admitting: Cardiology

## 2012-05-10 DIAGNOSIS — R079 Chest pain, unspecified: Secondary | ICD-10-CM

## 2012-05-18 ENCOUNTER — Encounter (HOSPITAL_COMMUNITY)
Admission: RE | Admit: 2012-05-18 | Discharge: 2012-05-18 | Disposition: A | Discharge status: Home / Self Care | Payer: Medicaid Other | Source: Ambulatory Visit | Attending: Cardiology | Admitting: Cardiology

## 2012-05-18 ENCOUNTER — Other Ambulatory Visit (HOSPITAL_COMMUNITY): Payer: Self-pay

## 2012-05-18 DIAGNOSIS — R079 Chest pain, unspecified: Secondary | ICD-10-CM | POA: Insufficient documentation

## 2012-05-18 DIAGNOSIS — I10 Essential (primary) hypertension: Secondary | ICD-10-CM | POA: Insufficient documentation

## 2012-05-18 DIAGNOSIS — I252 Old myocardial infarction: Secondary | ICD-10-CM | POA: Insufficient documentation

## 2012-05-18 DIAGNOSIS — I2510 Atherosclerotic heart disease of native coronary artery without angina pectoris: Secondary | ICD-10-CM | POA: Insufficient documentation

## 2012-05-18 MED ORDER — REGADENOSON 0.4 MG/5ML IV SOLN
0.4000 mg | Freq: Once | INTRAVENOUS | Status: DC
  Administered 2012-05-18: 0.4 mg via INTRAVENOUS

## 2012-05-18 MED ORDER — TECHNETIUM TC 99M SESTAMIBI GENERIC - CARDIOLITE: 30.0000 | Freq: Once | INTRAVENOUS | Status: AC | PRN

## 2012-05-19 ENCOUNTER — Encounter (HOSPITAL_COMMUNITY)
Admission: RE | Admit: 2012-05-19 | Discharge: 2012-05-19 | Disposition: A | Discharge status: Home / Self Care | Payer: Medicaid Other | Source: Ambulatory Visit | Attending: Cardiology | Admitting: Cardiology

## 2012-05-19 MED ORDER — TECHNETIUM TC 99M SESTAMIBI GENERIC - CARDIOLITE: 10.0000 | Freq: Once | INTRAVENOUS | Status: AC | PRN

## 2012-05-24 ENCOUNTER — Encounter (HOSPITAL_COMMUNITY): Payer: Self-pay | Admitting: Pharmacist

## 2012-05-26 ENCOUNTER — Ambulatory Visit (HOSPITAL_COMMUNITY)
Admission: RE | Admit: 2012-05-26 | Discharge: 2012-05-26 | Disposition: A | Discharge status: Home / Self Care | Payer: No Typology Code available for payment source | Source: Ambulatory Visit | Attending: Cardiology | Admitting: Cardiology

## 2012-05-26 ENCOUNTER — Encounter (HOSPITAL_COMMUNITY)
Admission: RE | Disposition: A | Discharge status: Home / Self Care | Payer: Self-pay | Source: Ambulatory Visit | Attending: Cardiology

## 2012-05-26 DIAGNOSIS — E7800 Pure hypercholesterolemia, unspecified: Secondary | ICD-10-CM | POA: Insufficient documentation

## 2012-05-26 DIAGNOSIS — R0609 Other forms of dyspnea: Secondary | ICD-10-CM | POA: Insufficient documentation

## 2012-05-26 DIAGNOSIS — R0989 Other specified symptoms and signs involving the circulatory and respiratory systems: Secondary | ICD-10-CM | POA: Insufficient documentation

## 2012-05-26 DIAGNOSIS — I2510 Atherosclerotic heart disease of native coronary artery without angina pectoris: Secondary | ICD-10-CM | POA: Insufficient documentation

## 2012-05-26 DIAGNOSIS — Z8249 Family history of ischemic heart disease and other diseases of the circulatory system: Secondary | ICD-10-CM | POA: Insufficient documentation

## 2012-05-26 DIAGNOSIS — I209 Angina pectoris, unspecified: Secondary | ICD-10-CM | POA: Insufficient documentation

## 2012-05-26 DIAGNOSIS — F17200 Nicotine dependence, unspecified, uncomplicated: Secondary | ICD-10-CM | POA: Insufficient documentation

## 2012-05-26 DIAGNOSIS — I10 Essential (primary) hypertension: Secondary | ICD-10-CM | POA: Insufficient documentation

## 2012-05-26 HISTORY — PX: LEFT HEART CATHETERIZATION WITH CORONARY ANGIOGRAM: SHX5451

## 2012-05-26 SURGERY — LEFT HEART CATHETERIZATION WITH CORONARY ANGIOGRAM
Anesthesia: LOCAL

## 2012-05-26 MED ORDER — ACETAMINOPHEN 325 MG PO TABS: 650.0000 mg | ORAL_TABLET | ORAL | Status: DC | PRN

## 2012-05-26 MED ORDER — ASPIRIN 81 MG PO CHEW
324.0000 mg | CHEWABLE_TABLET | ORAL | Status: AC
  Administered 2012-05-26: 324 mg via ORAL
  Filled 2012-05-26: qty 4

## 2012-05-26 MED ORDER — OXYCODONE-ACETAMINOPHEN 5-325 MG PO TABS: 1.0000 | ORAL_TABLET | ORAL | Status: DC | PRN

## 2012-05-26 MED ORDER — SODIUM CHLORIDE 0.9 % IJ SOLN: 3.0000 mL | INTRAMUSCULAR | Status: DC | PRN

## 2012-05-26 MED ORDER — SODIUM CHLORIDE 0.9 % IV SOLN: INTRAVENOUS | Status: DC

## 2012-05-26 MED ORDER — LIDOCAINE HCL (PF) 1 % IJ SOLN
INTRAMUSCULAR | Status: AC
  Filled 2012-05-26: qty 30

## 2012-05-26 MED ORDER — FENTANYL CITRATE 0.05 MG/ML IJ SOLN
INTRAMUSCULAR | Status: AC
  Filled 2012-05-26: qty 2

## 2012-05-26 MED ORDER — MIDAZOLAM HCL 5 MG/5ML IJ SOLN
INTRAMUSCULAR | Status: AC
  Filled 2012-05-26: qty 5

## 2012-05-26 MED ORDER — SODIUM CHLORIDE 0.9 % IJ SOLN: 3.0000 mL | Freq: Two times a day (BID) | INTRAMUSCULAR | Status: DC

## 2012-05-26 MED ORDER — SODIUM CHLORIDE 0.9 % IV SOLN
INTRAVENOUS | Status: DC
  Administered 2012-05-26: 09:00:00 via INTRAVENOUS

## 2012-05-26 MED ORDER — HEPARIN (PORCINE) IN NACL 2-0.9 UNIT/ML-% IJ SOLN
INTRAMUSCULAR | Status: AC
  Filled 2012-05-26: qty 1000

## 2012-05-26 MED ORDER — ONDANSETRON HCL 4 MG/2ML IJ SOLN: 4.0000 mg | Freq: Four times a day (QID) | INTRAMUSCULAR | Status: DC | PRN

## 2012-05-26 MED ORDER — DIAZEPAM 5 MG PO TABS
5.0000 mg | ORAL_TABLET | ORAL | Status: AC
  Administered 2012-05-26: 5 mg via ORAL
  Filled 2012-05-26: qty 1

## 2012-05-26 MED ORDER — SODIUM CHLORIDE 0.9 % IV SOLN: 250.0000 mL | INTRAVENOUS | Status: DC | PRN

## 2012-05-26 NOTE — CV Procedure (Signed)
Cardiac cath report dictated on 05/26/2012 dictation number is [phone]

## 2012-05-26 NOTE — H&P (Signed)
  Handwritten H&P in the chart needs to be scanned. 

## 2012-05-27 NOTE — Cardiovascular Report (Signed)
[name], GICK NO.:  [id number]  MEDICAL RECORD NO.:  [id number]  LOCATION:  [facility]                         FACILITY:  [facility]  PHYSICIAN:  Eduardo Osier. Sharyn Lull, M.D. DATE OF BIRTH:  04-17-1970  DATE OF PROCEDURE:  05/26/2012 DATE OF DISCHARGE:  05/26/2012                           CARDIAC CATHETERIZATION   PROCEDURE:  Left cardiac cath with selective left and right coronary angiography, left ventriculography via right groin using Judkins technique.  INDICATION FOR THE PROCEDURE:  This patient is a 42 year old female with past medical history significant for coronary artery disease, history of questionable MI in 2004, hypertension, hypercholesteremia, tobacco abuse, morbid obesity.  Positive family history of coronary artery disease.  She complains of retrosternal and left-sided chest pain radiating to the left arm off and on when under stress are with exertion, relieved with rest in 10-15 minutes.  Denies any nausea, vomiting, diaphoresis.  She states the chest pain was grade 7/10.  Also complains of exertional dyspnea with minimal exertion.  Denies palpitation lightheadedness or syncope.  Denies PND, orthopnea, or leg swelling.  EKG done in the office showed normal sinus rhythm with LVH with strain pattern, and WPW syndrome.  The patient subsequently underwent elective scan Myoview on May 19, 2012 which showed anterior and inferior wall scar and reversible ischemia in the apical lateral segment with EF of 48%.  Due to typical anginal chest pain.  Abnormal stress test multiple risk factors, discussed with the patient regarding left cath, possible PTCA stenting, its risks and benefits, i.e., death, MI, stroke, need for emergency CABG, local vascular complications, etc., and consented for PCI.  PROCEDURE IN DETAIL:  After obtaining the informed consent, the patient was brought to the cath lab and was placed on fluoroscopy table.  Right groin was prepped and  draped in the usual fashion.  Xylocaine 1% was used for local anesthesia in the right groin.  With the help of a thin wall needle, a 5-French arterial sheath was placed.  The sheath was aspirated and flushed.  Next, a 5-French left Judkins catheter was advanced over the wire under fluoroscopic guidance up to the ascending aorta.  Wire was pulled out. The catheter was aspirated and connected to the Manifold.  Catheter was further advanced and engaged into left coronary ostium.  Multiple views of the left system were taken.  Next, the catheter was disengaged and was pulled out over the wire and was replaced with 5-French right Judkins catheter which was advanced over the wire under fluoroscopic guidance up to the ascending aorta. Wire was pulled out.  The catheter was aspirated and connected to the Manifold.  Catheter was further advanced across the aortic valve into the LV.  LV pressures were recorded.  Next, LV graft was done in 30-degree RAO position.  Post-angiographic pressures were recorded from LV and then pullback pressures were recorded from the aorta.  There was no gradient across aortic valve.  Next, the pigtail catheter was pulled out over the wire.  Sheaths were aspirated and flushed.  FINDINGS:  LV showed good LV systolic function, EF of 60% to 65%.  LVH of the left main was patent.  LAD has  10% to 15% proximal and mid stenosis.  On diagonal 1, there is a small which is patent.  Ramus is large which has 15% to 20% proximal stenosis.  Left circumflex is large which is patent.  OM 1 is large which is patent.  RCA is patent.  PDA and PLV branches were small which were patent.  The patient tolerated procedure well.  There were no complications.  The patient was transferred to recovery room in stable condition.     Eduardo Osier. Sharyn Lull, M.D.     MNH/MEDQ  D:  05/26/2012  T:  05/27/2012  Job:  [id number]

## 2012-08-12 ENCOUNTER — Encounter (HOSPITAL_COMMUNITY): Payer: Self-pay

## 2012-08-12 ENCOUNTER — Emergency Department (HOSPITAL_COMMUNITY)
Admission: EM | Admit: 2012-08-12 | Discharge: 2012-08-12 | Disposition: A | Discharge status: Home / Self Care | Payer: Medicaid Other | Attending: Emergency Medicine | Admitting: Emergency Medicine

## 2012-08-12 DIAGNOSIS — F3289 Other specified depressive episodes: Secondary | ICD-10-CM | POA: Insufficient documentation

## 2012-08-12 DIAGNOSIS — N6459 Other signs and symptoms in breast: Secondary | ICD-10-CM | POA: Insufficient documentation

## 2012-08-12 DIAGNOSIS — Z79899 Other long term (current) drug therapy: Secondary | ICD-10-CM | POA: Insufficient documentation

## 2012-08-12 DIAGNOSIS — I252 Old myocardial infarction: Secondary | ICD-10-CM | POA: Insufficient documentation

## 2012-08-12 DIAGNOSIS — N644 Mastodynia: Secondary | ICD-10-CM | POA: Insufficient documentation

## 2012-08-12 DIAGNOSIS — I2510 Atherosclerotic heart disease of native coronary artery without angina pectoris: Secondary | ICD-10-CM | POA: Insufficient documentation

## 2012-08-12 DIAGNOSIS — F17200 Nicotine dependence, unspecified, uncomplicated: Secondary | ICD-10-CM | POA: Insufficient documentation

## 2012-08-12 DIAGNOSIS — I10 Essential (primary) hypertension: Secondary | ICD-10-CM | POA: Insufficient documentation

## 2012-08-12 DIAGNOSIS — F329 Major depressive disorder, single episode, unspecified: Secondary | ICD-10-CM | POA: Insufficient documentation

## 2012-08-12 MED ORDER — HYDROCODONE-ACETAMINOPHEN 5-325 MG PO TABS
2.0000 | ORAL_TABLET | Freq: Once | ORAL | Status: AC
  Administered 2012-08-12: 2 via ORAL
  Filled 2012-08-12: qty 2

## 2012-08-12 MED ORDER — CEPHALEXIN 500 MG PO CAPS: 500.0000 mg | ORAL_CAPSULE | Freq: Four times a day (QID) | ORAL | Status: DC

## 2012-08-12 MED ORDER — HYDROCODONE-ACETAMINOPHEN 5-325 MG PO TABS: 2.0000 | ORAL_TABLET | Freq: Four times a day (QID) | ORAL | Status: DC | PRN

## 2012-08-12 NOTE — ED Notes (Signed)
Per EMS- patient reports that she has an abscess area behind right nipple area. Patient states that she had this 2 years ago and had to have and  & D with packing. Patient states there has been yellow drainage from the abscessed area.
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
## 2012-08-13 NOTE — ED Provider Notes (Signed)
Medical screening examination/treatment/procedure(s) were performed by non-physician practitioner and as supervising physician I was immediately available for consultation/collaboration.    Nelia Shi, MD 08/13/12 [phone]

## 2012-11-23 ENCOUNTER — Other Ambulatory Visit: Payer: Self-pay | Admitting: Orthopedic Surgery

## 2012-11-23 DIAGNOSIS — M549 Dorsalgia, unspecified: Secondary | ICD-10-CM

## 2012-11-25 ENCOUNTER — Ambulatory Visit
Admission: RE | Admit: 2012-11-25 | Discharge: 2012-11-25 | Disposition: A | Discharge status: Home / Self Care | Payer: Medicaid Other | Source: Ambulatory Visit | Attending: Orthopedic Surgery | Admitting: Orthopedic Surgery

## 2012-11-25 ENCOUNTER — Other Ambulatory Visit: Payer: Self-pay | Admitting: Orthopedic Surgery

## 2012-11-25 VITALS — BP 138/83 | HR 72

## 2012-11-25 DIAGNOSIS — M549 Dorsalgia, unspecified: Secondary | ICD-10-CM

## 2012-11-25 MED ORDER — METHYLPREDNISOLONE ACETATE 40 MG/ML INJ SUSP (RADIOLOG
120.0000 mg | Freq: Once | INTRAMUSCULAR | Status: AC
  Administered 2012-11-25: 120 mg via EPIDURAL

## 2012-11-25 MED ORDER — IOHEXOL 180 MG/ML  SOLN
1.0000 mL | Freq: Once | INTRAMUSCULAR | Status: AC | PRN
  Administered 2012-11-25: 1 mL via EPIDURAL

## 2013-01-05 ENCOUNTER — Encounter: Payer: Self-pay | Admitting: Obstetrics

## 2013-01-12 ENCOUNTER — Encounter (HOSPITAL_COMMUNITY): Payer: Self-pay | Admitting: *Deleted

## 2013-01-12 ENCOUNTER — Inpatient Hospital Stay (HOSPITAL_COMMUNITY)
Admission: AD | Admit: 2013-01-12 | Discharge: 2013-01-12 | Disposition: A | Discharge status: Home / Self Care | Payer: Medicaid Other | Source: Ambulatory Visit | Attending: Obstetrics and Gynecology | Admitting: Obstetrics and Gynecology

## 2013-01-12 ENCOUNTER — Inpatient Hospital Stay (HOSPITAL_COMMUNITY): Payer: Medicaid Other

## 2013-01-12 DIAGNOSIS — N920 Excessive and frequent menstruation with regular cycle: Secondary | ICD-10-CM | POA: Insufficient documentation

## 2013-01-12 DIAGNOSIS — M545 Low back pain, unspecified: Secondary | ICD-10-CM | POA: Insufficient documentation

## 2013-01-12 DIAGNOSIS — D259 Leiomyoma of uterus, unspecified: Secondary | ICD-10-CM | POA: Insufficient documentation

## 2013-01-12 DIAGNOSIS — A5901 Trichomonal vulvovaginitis: Secondary | ICD-10-CM | POA: Insufficient documentation

## 2013-01-12 LAB — URINALYSIS, ROUTINE W REFLEX MICROSCOPIC
Bilirubin Urine: NEGATIVE
Leukocytes, UA: NEGATIVE
Nitrite: NEGATIVE
Specific Gravity, Urine: 1.025 (ref 1.005–1.030)
Urobilinogen, UA: 0.2 mg/dL (ref 0.0–1.0)
pH: 6 (ref 5.0–8.0)

## 2013-01-12 LAB — WET PREP, GENITAL: Yeast Wet Prep HPF POC: NONE SEEN

## 2013-01-12 LAB — CBC
Hemoglobin: 12.7 g/dL (ref 12.0–15.0)
RBC: 4.25 MIL/uL (ref 3.87–5.11)
RDW: 15.8 % — ABNORMAL HIGH (ref 11.5–15.5)

## 2013-01-12 LAB — URINE MICROSCOPIC-ADD ON

## 2013-01-12 MED ORDER — HYDROMORPHONE HCL PF 1 MG/ML IJ SOLN
1.0000 mg | Freq: Once | INTRAMUSCULAR | Status: AC
  Administered 2013-01-12: 1 mg via INTRAMUSCULAR
  Filled 2013-01-12: qty 1

## 2013-01-12 MED ORDER — DIPHENHYDRAMINE HCL 50 MG/ML IJ SOLN
50.0000 mg | Freq: Once | INTRAMUSCULAR | Status: AC
  Administered 2013-01-12: 50 mg via INTRAMUSCULAR
  Filled 2013-01-12: qty 1

## 2013-01-12 MED ORDER — MEGESTROL ACETATE 40 MG PO TABS: ORAL_TABLET | ORAL | Status: DC

## 2013-01-12 MED ORDER — IBUPROFEN 800 MG PO TABS: 800.0000 mg | ORAL_TABLET | Freq: Three times a day (TID) | ORAL | Status: DC

## 2013-01-12 MED ORDER — METRONIDAZOLE 500 MG PO TABS
2000.0000 mg | ORAL_TABLET | Freq: Once | ORAL | Status: AC
  Administered 2013-01-12: 2000 mg via ORAL
  Filled 2013-01-12: qty 4

## 2013-01-12 NOTE — MAU Note (Signed)
Pt has been having back pain. Told she has fibriods. Her periods are heavy and having a lot of cramping
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
## 2013-01-13 ENCOUNTER — Encounter: Payer: Self-pay | Admitting: Obstetrics & Gynecology

## 2013-01-13 LAB — GC/CHLAMYDIA PROBE AMP: CT Probe RNA: NEGATIVE

## 2013-01-13 NOTE — MAU Provider Note (Signed)
Attestation of Attending Supervision of Advanced Practitioner (CNM/NP): Evaluation and management procedures were performed by the Advanced Practitioner under my supervision and collaboration.  I have reviewed the Advanced Practitioner's note and chart, and I agree with the management and plan.  Saliou Barnier 01/13/2013 5:40 AM   

## 2013-01-16 ENCOUNTER — Ambulatory Visit: Payer: Self-pay | Admitting: Obstetrics

## 2013-02-06 ENCOUNTER — Encounter: Payer: Medicaid Other | Admitting: Obstetrics & Gynecology

## 2013-03-31 ENCOUNTER — Encounter: Payer: Self-pay | Admitting: Obstetrics

## 2013-04-12 ENCOUNTER — Encounter: Payer: Self-pay | Admitting: Obstetrics

## 2013-04-20 ENCOUNTER — Ambulatory Visit: Payer: Medicaid Other | Admitting: Obstetrics

## 2013-05-12 ENCOUNTER — Emergency Department (HOSPITAL_COMMUNITY): Payer: Medicaid Other

## 2013-05-12 ENCOUNTER — Encounter (HOSPITAL_COMMUNITY): Payer: Self-pay | Admitting: Emergency Medicine

## 2013-05-12 ENCOUNTER — Emergency Department (HOSPITAL_COMMUNITY)
Admission: EM | Admit: 2013-05-12 | Discharge: 2013-05-12 | Disposition: A | Discharge status: Home / Self Care | Payer: Medicaid Other | Attending: Emergency Medicine | Admitting: Emergency Medicine

## 2013-05-12 DIAGNOSIS — R05 Cough: Secondary | ICD-10-CM | POA: Insufficient documentation

## 2013-05-12 DIAGNOSIS — T8189XA Other complications of procedures, not elsewhere classified, initial encounter: Secondary | ICD-10-CM

## 2013-05-12 DIAGNOSIS — F3289 Other specified depressive episodes: Secondary | ICD-10-CM | POA: Insufficient documentation

## 2013-05-12 DIAGNOSIS — R52 Pain, unspecified: Secondary | ICD-10-CM | POA: Insufficient documentation

## 2013-05-12 DIAGNOSIS — Z4802 Encounter for removal of sutures: Secondary | ICD-10-CM | POA: Insufficient documentation

## 2013-05-12 DIAGNOSIS — Z79899 Other long term (current) drug therapy: Secondary | ICD-10-CM | POA: Insufficient documentation

## 2013-05-12 DIAGNOSIS — I10 Essential (primary) hypertension: Secondary | ICD-10-CM | POA: Insufficient documentation

## 2013-05-12 DIAGNOSIS — F17200 Nicotine dependence, unspecified, uncomplicated: Secondary | ICD-10-CM | POA: Insufficient documentation

## 2013-05-12 DIAGNOSIS — Z791 Long term (current) use of non-steroidal anti-inflammatories (NSAID): Secondary | ICD-10-CM | POA: Insufficient documentation

## 2013-05-12 DIAGNOSIS — Z189 Retained foreign body fragments, unspecified material: Secondary | ICD-10-CM

## 2013-05-12 DIAGNOSIS — I2510 Atherosclerotic heart disease of native coronary artery without angina pectoris: Secondary | ICD-10-CM | POA: Insufficient documentation

## 2013-05-12 DIAGNOSIS — R109 Unspecified abdominal pain: Secondary | ICD-10-CM

## 2013-05-12 DIAGNOSIS — I252 Old myocardial infarction: Secondary | ICD-10-CM | POA: Insufficient documentation

## 2013-05-12 DIAGNOSIS — F329 Major depressive disorder, single episode, unspecified: Secondary | ICD-10-CM | POA: Insufficient documentation

## 2013-05-12 DIAGNOSIS — R059 Cough, unspecified: Secondary | ICD-10-CM | POA: Insufficient documentation

## 2013-05-12 LAB — CBC WITH DIFFERENTIAL/PLATELET
BASOS ABS: 0 10*3/uL (ref 0.0–0.1)
Basophils Relative: 0 % (ref 0–1)
EOS PCT: 2 % (ref 0–5)
Eosinophils Absolute: 0.1 10*3/uL (ref 0.0–0.7)
HCT: 44.8 % (ref 36.0–46.0)
HEMOGLOBIN: 15 g/dL (ref 12.0–15.0)
LYMPHS ABS: 1.5 10*3/uL (ref 0.7–4.0)
Lymphocytes Relative: 22 % (ref 12–46)
MCH: 30.4 pg (ref 26.0–34.0)
MCHC: 33.5 g/dL (ref 30.0–36.0)
MCV: 90.9 fL (ref 78.0–100.0)
MONO ABS: 0.5 10*3/uL (ref 0.1–1.0)
MONOS PCT: 7 % (ref 3–12)
Neutro Abs: 4.6 10*3/uL (ref 1.7–7.7)
Neutrophils Relative %: 69 % (ref 43–77)
Platelets: 257 10*3/uL (ref 150–400)
RBC: 4.93 MIL/uL (ref 3.87–5.11)
RDW: 13.5 % (ref 11.5–15.5)
WBC: 6.7 10*3/uL (ref 4.0–10.5)

## 2013-05-12 LAB — URINALYSIS, ROUTINE W REFLEX MICROSCOPIC
BILIRUBIN URINE: NEGATIVE
Glucose, UA: NEGATIVE mg/dL
Ketones, ur: NEGATIVE mg/dL
Leukocytes, UA: NEGATIVE
NITRITE: NEGATIVE
Protein, ur: NEGATIVE mg/dL
Specific Gravity, Urine: 1.008 (ref 1.005–1.030)
UROBILINOGEN UA: 1 mg/dL (ref 0.0–1.0)
pH: 7 (ref 5.0–8.0)

## 2013-05-12 LAB — URINE MICROSCOPIC-ADD ON

## 2013-05-12 LAB — COMPREHENSIVE METABOLIC PANEL
ALT: 20 U/L (ref 0–35)
AST: 18 U/L (ref 0–37)
Albumin: 3.5 g/dL (ref 3.5–5.2)
Alkaline Phosphatase: 116 U/L (ref 39–117)
BUN: 7 mg/dL (ref 6–23)
CALCIUM: 9 mg/dL (ref 8.4–10.5)
CHLORIDE: 101 meq/L (ref 96–112)
CO2: 26 meq/L (ref 19–32)
CREATININE: 0.77 mg/dL (ref 0.50–1.10)
GLUCOSE: 77 mg/dL (ref 70–99)
Potassium: 3.4 mEq/L — ABNORMAL LOW (ref 3.7–5.3)
Sodium: 138 mEq/L (ref 137–147)
Total Bilirubin: 0.3 mg/dL (ref 0.3–1.2)
Total Protein: 7.3 g/dL (ref 6.0–8.3)

## 2013-05-12 LAB — LIPASE, BLOOD: Lipase: 29 U/L (ref 11–59)

## 2013-05-12 MED ORDER — PROMETHAZINE HCL 25 MG PO TABS: 25.0000 mg | ORAL_TABLET | Freq: Four times a day (QID) | ORAL | Status: DC | PRN

## 2013-05-12 MED ORDER — HYDROMORPHONE HCL PF 1 MG/ML IJ SOLN
1.0000 mg | Freq: Once | INTRAMUSCULAR | Status: AC
  Administered 2013-05-12: 1 mg via INTRAVENOUS
  Filled 2013-05-12: qty 1

## 2013-05-12 MED ORDER — SODIUM CHLORIDE 0.9 % IV BOLUS (SEPSIS)
1000.0000 mL | Freq: Once | INTRAVENOUS | Status: AC
  Administered 2013-05-12: 1000 mL via INTRAVENOUS

## 2013-05-12 MED ORDER — IOHEXOL 300 MG/ML  SOLN
100.0000 mL | Freq: Once | INTRAMUSCULAR | Status: AC | PRN
  Administered 2013-05-12: 100 mL via INTRAVENOUS

## 2013-05-12 MED ORDER — AMOXICILLIN-POT CLAVULANATE 875-125 MG PO TABS: 1.0000 | ORAL_TABLET | Freq: Two times a day (BID) | ORAL | Status: DC

## 2013-05-12 MED ORDER — PERCOCET 5-325 MG PO TABS: 1.0000 | ORAL_TABLET | Freq: Four times a day (QID) | ORAL | Status: DC | PRN

## 2013-05-12 MED ORDER — ONDANSETRON HCL 4 MG/2ML IJ SOLN
4.0000 mg | Freq: Once | INTRAMUSCULAR | Status: AC
  Administered 2013-05-12: 4 mg via INTRAVENOUS
  Filled 2013-05-12: qty 2

## 2013-05-12 NOTE — Discharge Instructions (Signed)
Return here as needed.  Followup with your regular doctor for recheck.  Keep the area on your side clean and dry
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
## 2013-05-12 NOTE — ED Notes (Addendum)
Pt brought from home by EMS.  Starting Monday, pt has been having headache with cough and congestion.  No fever.  Generalized body aches.  States pain radiates from back down to her legs. Pt also wants to be seen for abdominal pain since Monday also.  Has had same pain off and on but worsening this week.  Feels like something pulling in abdomen.  Pt has had nausea but no vomiting.  Pt states diarrhea this am.  Has had hx of fibroids and states this does feel similar.

## 2013-05-12 NOTE — ED Notes (Signed)
Per EMS-started having flu like symptoms after school, about two hours ago

## 2013-05-14 NOTE — ED Provider Notes (Signed)
Medical screening examination/treatment/procedure(s) were performed by non-physician practitioner and as supervising physician I was immediately available for consultation/collaboration.   Babette Relic, MD 05/14/13 [phone]

## 2013-06-29 ENCOUNTER — Encounter (HOSPITAL_COMMUNITY): Payer: Self-pay | Admitting: *Deleted

## 2013-06-29 ENCOUNTER — Inpatient Hospital Stay (HOSPITAL_COMMUNITY): Payer: Medicaid Other

## 2013-06-29 ENCOUNTER — Inpatient Hospital Stay (HOSPITAL_COMMUNITY)
Admission: AD | Admit: 2013-06-29 | Discharge: 2013-06-29 | Disposition: A | Discharge status: Home / Self Care | Payer: Medicaid Other | Source: Ambulatory Visit | Attending: Obstetrics & Gynecology | Admitting: Obstetrics & Gynecology

## 2013-06-29 DIAGNOSIS — R102 Pelvic and perineal pain: Secondary | ICD-10-CM

## 2013-06-29 DIAGNOSIS — N8500 Endometrial hyperplasia, unspecified: Secondary | ICD-10-CM | POA: Insufficient documentation

## 2013-06-29 DIAGNOSIS — I10 Essential (primary) hypertension: Secondary | ICD-10-CM | POA: Insufficient documentation

## 2013-06-29 DIAGNOSIS — D259 Leiomyoma of uterus, unspecified: Secondary | ICD-10-CM | POA: Insufficient documentation

## 2013-06-29 DIAGNOSIS — I2510 Atherosclerotic heart disease of native coronary artery without angina pectoris: Secondary | ICD-10-CM | POA: Insufficient documentation

## 2013-06-29 DIAGNOSIS — R109 Unspecified abdominal pain: Secondary | ICD-10-CM | POA: Insufficient documentation

## 2013-06-29 DIAGNOSIS — F17200 Nicotine dependence, unspecified, uncomplicated: Secondary | ICD-10-CM | POA: Insufficient documentation

## 2013-06-29 DIAGNOSIS — N949 Unspecified condition associated with female genital organs and menstrual cycle: Secondary | ICD-10-CM | POA: Insufficient documentation

## 2013-06-29 LAB — URINALYSIS, ROUTINE W REFLEX MICROSCOPIC
Bilirubin Urine: NEGATIVE
Glucose, UA: NEGATIVE mg/dL
Hgb urine dipstick: NEGATIVE
Ketones, ur: 15 mg/dL — AB
NITRITE: NEGATIVE
PH: 6.5 (ref 5.0–8.0)
Protein, ur: NEGATIVE mg/dL
SPECIFIC GRAVITY, URINE: 1.02 (ref 1.005–1.030)
Urobilinogen, UA: 1 mg/dL (ref 0.0–1.0)

## 2013-06-29 LAB — URINE MICROSCOPIC-ADD ON

## 2013-06-29 LAB — WET PREP, GENITAL
Trich, Wet Prep: NONE SEEN
YEAST WET PREP: NONE SEEN

## 2013-06-29 LAB — POCT PREGNANCY, URINE: PREG TEST UR: NEGATIVE

## 2013-06-29 MED ORDER — KETOROLAC TROMETHAMINE 60 MG/2ML IM SOLN
60.0000 mg | Freq: Once | INTRAMUSCULAR | Status: AC
  Administered 2013-06-29: 60 mg via INTRAMUSCULAR
  Filled 2013-06-29: qty 2

## 2013-06-29 MED ORDER — KETOROLAC TROMETHAMINE 10 MG PO TABS: 10.0000 mg | ORAL_TABLET | Freq: Four times a day (QID) | ORAL | Status: DC | PRN
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
## 2013-06-29 NOTE — MAU Note (Signed)
Patient states she has been diagnosed with fibroids and was scheduled to come to the [facility] but could not come. States she has been having pelvic and abdominal pain for about 2 weeks. Denies bleeding or vaginal discharge at this time. Has nausea, no vomiting.

## 2013-06-29 NOTE — Discharge Instructions (Signed)
Endometrial Biopsy Endometrial biopsy is a procedure in which a tissue sample is taken from inside the uterus. The tissue sample is then looked at under a microscope to see if the tissue is normal or abnormal. The endometrium is the lining of the uterus. This procedure helps determine where you are in your menstrual cycle and how hormone levels are affecting the lining of the uterus. This procedure may also be used to evaluate uterine bleeding or to diagnose endometrial cancer, tuberculosis, polyps, or inflammatory conditions.  LET YOUR HEALTH CARE PROVIDER KNOW ABOUT:  Any allergies you have.  All medicines you are taking, including vitamins, herbs, eye drops, creams, and over-the-counter medicines.  Previous problems you or members of your family have had with the use of anesthetics.  Any blood disorders you have.  Previous surgeries you have had.  Medical conditions you have.  Possibility of pregnancy. RISKS AND COMPLICATIONS Generally, this is a safe procedure. However, as with any procedure, complications can occur. Possible complications include:  Bleeding.  Pelvic infection.  Puncture of the uterine wall with the biopsy device (rare). BEFORE THE PROCEDURE   Keep a record of your menstrual cycles as directed by your health care provider. You may need to schedule your procedure for a specific time in your cycle.  You may want to bring a sanitary pad to wear home after the procedure.  Arrange for someone to drive you home after the procedure if you will be given a medicine to help you relax (sedative). PROCEDURE   You may be given a sedative to relax you.  You will lie on an exam table with your feet and legs supported as in a pelvic exam.  Your health care provider will insert an instrument (speculum) into your vagina to see your cervix.  Your cervix will be cleansed with an antiseptic solution. A medicine (local anesthetic) will be used to numb the cervix.  A forceps  instrument (tenaculum) will be used to hold your cervix steady for the biopsy.  A thin, rodlike instrument (uterine sound) will be inserted through your cervix to determine the length of your uterus and the location where the biopsy sample will be removed.  A thin, flexible tube (catheter) will be inserted through your cervix and into the uterus. The catheter is used to collect the biopsy sample from your endometrial tissue.  The catheter and speculum will then be removed, and the tissue sample will be sent to a lab for examination. AFTER THE PROCEDURE  You will rest in a recovery area until you are ready to go home.  You may have mild cramping and a small amount of vaginal bleeding for a few days after the procedure. This is normal.  Make sure you find out how to get your test results. Document Released: 06/26/2004 Document Revised: 10/26/2012 Document Reviewed: 08/10/2012 [employer] Patient Information 2014 [employer].  

## 2013-06-29 NOTE — ED Notes (Signed)
Pt not in [employer] went outside to check for her and she was out there pt escorted to room.

## 2013-06-30 LAB — GC/CHLAMYDIA PROBE AMP
CT Probe RNA: NEGATIVE
GC Probe RNA: NEGATIVE

## 2013-07-04 ENCOUNTER — Other Ambulatory Visit (HOSPITAL_COMMUNITY): Payer: Self-pay | Admitting: Advanced Practice Midwife

## 2013-07-04 ENCOUNTER — Encounter: Payer: Self-pay | Admitting: Obstetrics & Gynecology

## 2013-07-19 ENCOUNTER — Encounter (HOSPITAL_COMMUNITY): Payer: Self-pay | Admitting: Emergency Medicine

## 2013-07-19 ENCOUNTER — Emergency Department (HOSPITAL_COMMUNITY)
Admission: EM | Admit: 2013-07-19 | Discharge: 2013-07-19 | Disposition: A | Discharge status: Home / Self Care | Payer: Medicaid Other | Attending: Emergency Medicine | Admitting: Emergency Medicine

## 2013-07-19 DIAGNOSIS — M5431 Sciatica, right side: Secondary | ICD-10-CM

## 2013-07-19 DIAGNOSIS — Z791 Long term (current) use of non-steroidal anti-inflammatories (NSAID): Secondary | ICD-10-CM | POA: Insufficient documentation

## 2013-07-19 DIAGNOSIS — R110 Nausea: Secondary | ICD-10-CM | POA: Insufficient documentation

## 2013-07-19 DIAGNOSIS — I252 Old myocardial infarction: Secondary | ICD-10-CM | POA: Insufficient documentation

## 2013-07-19 DIAGNOSIS — R51 Headache: Secondary | ICD-10-CM | POA: Insufficient documentation

## 2013-07-19 DIAGNOSIS — M5430 Sciatica, unspecified side: Secondary | ICD-10-CM | POA: Insufficient documentation

## 2013-07-19 DIAGNOSIS — M129 Arthropathy, unspecified: Secondary | ICD-10-CM | POA: Insufficient documentation

## 2013-07-19 DIAGNOSIS — M545 Low back pain, unspecified: Secondary | ICD-10-CM

## 2013-07-19 DIAGNOSIS — R519 Headache, unspecified: Secondary | ICD-10-CM

## 2013-07-19 DIAGNOSIS — G8929 Other chronic pain: Secondary | ICD-10-CM

## 2013-07-19 DIAGNOSIS — F17200 Nicotine dependence, unspecified, uncomplicated: Secondary | ICD-10-CM | POA: Insufficient documentation

## 2013-07-19 DIAGNOSIS — Z79899 Other long term (current) drug therapy: Secondary | ICD-10-CM | POA: Insufficient documentation

## 2013-07-19 DIAGNOSIS — I10 Essential (primary) hypertension: Secondary | ICD-10-CM | POA: Insufficient documentation

## 2013-07-19 MED ORDER — METHOCARBAMOL 500 MG PO TABS: 500.0000 mg | ORAL_TABLET | Freq: Two times a day (BID) | ORAL | Status: DC

## 2013-07-19 MED ORDER — MELOXICAM 7.5 MG PO TABS: 15.0000 mg | ORAL_TABLET | Freq: Every day | ORAL | Status: DC

## 2013-07-19 MED ORDER — IBUPROFEN 800 MG PO TABS
800.0000 mg | ORAL_TABLET | Freq: Once | ORAL | Status: AC
  Administered 2013-07-19: 800 mg via ORAL
  Filled 2013-07-19: qty 1

## 2013-07-19 MED ORDER — OXYCODONE-ACETAMINOPHEN 5-325 MG PO TABS: 1.0000 | ORAL_TABLET | Freq: Four times a day (QID) | ORAL | Status: DC | PRN

## 2013-07-19 MED ORDER — OXYCODONE-ACETAMINOPHEN 5-325 MG PO TABS
2.0000 | ORAL_TABLET | Freq: Once | ORAL | Status: AC
  Administered 2013-07-19: 2 via ORAL
  Filled 2013-07-19: qty 2

## 2013-07-19 MED ORDER — METHOCARBAMOL 500 MG PO TABS
750.0000 mg | ORAL_TABLET | Freq: Once | ORAL | Status: AC
  Administered 2013-07-19: 750 mg via ORAL
  Filled 2013-07-19: qty 2

## 2013-07-19 NOTE — ED Notes (Signed)
Pt states that she has chronic back pain w/ hx of arthritis.  C/o low back pain and headache x 3 days.
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
## 2013-07-19 NOTE — ED Provider Notes (Signed)
Medical screening examination/treatment/procedure(s) were performed by non-physician practitioner and as supervising physician I was immediately available for consultation/collaboration.   EKG Interpretation None        Malvin Johns, MD 07/19/13 1507

## 2013-08-14 ENCOUNTER — Other Ambulatory Visit (HOSPITAL_COMMUNITY)
Admission: RE | Admit: 2013-08-14 | Discharge: 2013-08-14 | Disposition: A | Discharge status: Home / Self Care | Payer: Medicaid Other | Source: Ambulatory Visit | Attending: Obstetrics & Gynecology | Admitting: Obstetrics & Gynecology

## 2013-08-14 ENCOUNTER — Ambulatory Visit (INDEPENDENT_AMBULATORY_CARE_PROVIDER_SITE_OTHER): Payer: Medicaid Other | Admitting: Obstetrics & Gynecology

## 2013-08-14 ENCOUNTER — Encounter: Payer: Self-pay | Admitting: Obstetrics & Gynecology

## 2013-08-14 VITALS — BP 165/90 | HR 86 | Temp 98.7°F | Ht 64.0 in | Wt 298.6 lb

## 2013-08-14 DIAGNOSIS — N949 Unspecified condition associated with female genital organs and menstrual cycle: Secondary | ICD-10-CM

## 2013-08-14 DIAGNOSIS — N938 Other specified abnormal uterine and vaginal bleeding: Secondary | ICD-10-CM

## 2013-08-14 DIAGNOSIS — D259 Leiomyoma of uterus, unspecified: Secondary | ICD-10-CM | POA: Insufficient documentation

## 2013-08-14 DIAGNOSIS — R9389 Abnormal findings on diagnostic imaging of other specified body structures: Secondary | ICD-10-CM

## 2013-08-14 DIAGNOSIS — D219 Benign neoplasm of connective and other soft tissue, unspecified: Secondary | ICD-10-CM

## 2013-08-14 DIAGNOSIS — Z01419 Encounter for gynecological examination (general) (routine) without abnormal findings: Secondary | ICD-10-CM | POA: Diagnosis present

## 2013-08-14 DIAGNOSIS — N925 Other specified irregular menstruation: Secondary | ICD-10-CM

## 2013-08-14 DIAGNOSIS — Z113 Encounter for screening for infections with a predominantly sexual mode of transmission: Secondary | ICD-10-CM | POA: Diagnosis present

## 2013-08-14 DIAGNOSIS — Z1151 Encounter for screening for human papillomavirus (HPV): Secondary | ICD-10-CM | POA: Diagnosis present

## 2013-08-14 DIAGNOSIS — Z0000 Encounter for general adult medical examination without abnormal findings: Secondary | ICD-10-CM

## 2013-08-14 DIAGNOSIS — Z6841 Body Mass Index (BMI) 40.0 and over, adult: Secondary | ICD-10-CM

## 2013-08-14 LAB — POCT PREGNANCY, URINE
PREG TEST UR: NEGATIVE
Preg Test, Ur: NEGATIVE

## 2013-08-14 NOTE — Progress Notes (Signed)
   Subjective:    Patient ID: [name], female    DOB: 1970-06-14, 43 y.o.   MRN: [id number]  HPI  43 yo S AA P2 here with a year h/o DUB (sometimes 2 periods per month, always heavy). An u/s done recently showed fibroids and an endometrium of 19 mm.  Review of Systems No recent pap or mammogram    Objective:   Physical Exam   UPT negative, consent signed, time out done Cervix prepped with betadine and grasped with a single tooth tenaculum Uterus sounded to 9 cm Pipelle used for 2 passes with a moderate amount of tissue obtained. She tolerated the procedure well.  She requested narcotics but I explained that I do not give out narcotics for a EMBX.      Assessment & Plan:   DUB- await EMBX If negative, suggest Mirena as she would be at increased risk due to her morbid obesity, HTN, centripital obesity, and narcotic dependence

## 2013-08-16 LAB — CYTOLOGY - PAP

## 2013-09-04 ENCOUNTER — Encounter: Payer: Self-pay | Admitting: General Practice

## 2013-09-15 ENCOUNTER — Telehealth: Payer: Self-pay

## 2013-09-15 ENCOUNTER — Ambulatory Visit: Payer: Medicaid Other | Admitting: Obstetrics & Gynecology

## 2013-09-15 NOTE — Telephone Encounter (Signed)
Patient missed today's scheduled f/u appointment. Attempted to call patient. NO answer. Left message stating we are sorry you missed your appointment, please call clinic to reschedule.

## 2013-10-25 ENCOUNTER — Ambulatory Visit (INDEPENDENT_AMBULATORY_CARE_PROVIDER_SITE_OTHER): Payer: Medicaid Other | Admitting: Obstetrics & Gynecology

## 2013-10-25 ENCOUNTER — Encounter: Payer: Self-pay | Admitting: Obstetrics & Gynecology

## 2013-10-25 VITALS — BP 139/73 | HR 87 | Temp 98.4°F | Ht 64.0 in | Wt 278.3 lb

## 2013-10-25 DIAGNOSIS — N938 Other specified abnormal uterine and vaginal bleeding: Secondary | ICD-10-CM

## 2013-10-25 DIAGNOSIS — N949 Unspecified condition associated with female genital organs and menstrual cycle: Secondary | ICD-10-CM

## 2013-10-25 DIAGNOSIS — Z0000 Encounter for general adult medical examination without abnormal findings: Secondary | ICD-10-CM

## 2013-10-25 DIAGNOSIS — Z30430 Encounter for insertion of intrauterine contraceptive device: Secondary | ICD-10-CM

## 2013-10-25 MED ORDER — VITAMIN B-12 1000 MCG PO TABS: 1000.0000 ug | ORAL_TABLET | Freq: Every day | ORAL | Status: DC

## 2013-10-25 MED ORDER — LEVONORGESTREL 20 MCG/24HR IU IUD
INTRAUTERINE_SYSTEM | Freq: Once | INTRAUTERINE | Status: AC
Start: 2013-10-25 — End: 2013-10-25
  Administered 2013-10-25: 1 via INTRAUTERINE

## 2013-10-25 NOTE — Progress Notes (Signed)
   Subjective:    Patient ID: [name], female    DOB: [birth year], 43 y.o.   MRN: [id number]  HPI  43 yo with morbid obesity, DUB, and a small fibroid is here to discuss treatment options since her negative EMBX 6/15. I have recommended a trial of Mirena but did also offer cyclic provera (She has had a BTL and has been abstinent for the last year).   Review of Systems     Objective:   Physical Exam UPT negative, consent signed, Time out procedure done. Cervix prepped with betadine and grasped with a single tooth tenaculum. Mirena was easily placed and the strings were cut to 3-4 cm. Uterus sounded to 9 cm. She tolerated the procedure well.         Assessment & Plan:  DUB- trial of Mirena RTC 3 months

## 2013-10-25 NOTE — Progress Notes (Signed)
Patient here today to f/u results/discuss plan of care for DUB. Patient requests refills of ferrous sulfate, vitamin D and vitamin B12.

## 2013-10-27 ENCOUNTER — Encounter: Payer: Self-pay | Admitting: General Practice

## 2013-10-31 ENCOUNTER — Emergency Department (HOSPITAL_COMMUNITY)
Admission: EM | Admit: 2013-10-31 | Discharge: 2013-10-31 | Disposition: A | Discharge status: Home / Self Care | Payer: Medicaid Other | Attending: Emergency Medicine | Admitting: Emergency Medicine

## 2013-10-31 ENCOUNTER — Emergency Department (HOSPITAL_COMMUNITY): Payer: Medicaid Other

## 2013-10-31 ENCOUNTER — Encounter (HOSPITAL_COMMUNITY): Payer: Self-pay | Admitting: Emergency Medicine

## 2013-10-31 DIAGNOSIS — R52 Pain, unspecified: Secondary | ICD-10-CM | POA: Insufficient documentation

## 2013-10-31 DIAGNOSIS — G8929 Other chronic pain: Secondary | ICD-10-CM | POA: Diagnosis not present

## 2013-10-31 DIAGNOSIS — Z8659 Personal history of other mental and behavioral disorders: Secondary | ICD-10-CM | POA: Insufficient documentation

## 2013-10-31 DIAGNOSIS — M545 Low back pain, unspecified: Secondary | ICD-10-CM | POA: Diagnosis present

## 2013-10-31 DIAGNOSIS — M5430 Sciatica, unspecified side: Secondary | ICD-10-CM | POA: Insufficient documentation

## 2013-10-31 DIAGNOSIS — F17200 Nicotine dependence, unspecified, uncomplicated: Secondary | ICD-10-CM | POA: Diagnosis not present

## 2013-10-31 DIAGNOSIS — I10 Essential (primary) hypertension: Secondary | ICD-10-CM | POA: Diagnosis not present

## 2013-10-31 DIAGNOSIS — Z79899 Other long term (current) drug therapy: Secondary | ICD-10-CM | POA: Diagnosis not present

## 2013-10-31 DIAGNOSIS — I2510 Atherosclerotic heart disease of native coronary artery without angina pectoris: Secondary | ICD-10-CM | POA: Diagnosis not present

## 2013-10-31 DIAGNOSIS — Z791 Long term (current) use of non-steroidal anti-inflammatories (NSAID): Secondary | ICD-10-CM | POA: Insufficient documentation

## 2013-10-31 DIAGNOSIS — R51 Headache: Secondary | ICD-10-CM | POA: Diagnosis not present

## 2013-10-31 DIAGNOSIS — I252 Old myocardial infarction: Secondary | ICD-10-CM | POA: Diagnosis not present

## 2013-10-31 DIAGNOSIS — R519 Headache, unspecified: Secondary | ICD-10-CM

## 2013-10-31 DIAGNOSIS — M5431 Sciatica, right side: Secondary | ICD-10-CM

## 2013-10-31 LAB — URINE MICROSCOPIC-ADD ON

## 2013-10-31 LAB — URINALYSIS, ROUTINE W REFLEX MICROSCOPIC
Glucose, UA: NEGATIVE mg/dL
Ketones, ur: NEGATIVE mg/dL
NITRITE: NEGATIVE
Protein, ur: 30 mg/dL — AB
SPECIFIC GRAVITY, URINE: 1.03 (ref 1.005–1.030)
UROBILINOGEN UA: 1 mg/dL (ref 0.0–1.0)
pH: 6.5 (ref 5.0–8.0)

## 2013-10-31 MED ORDER — IBUPROFEN 800 MG PO TABS: 800.0000 mg | ORAL_TABLET | Freq: Three times a day (TID) | ORAL | Status: DC

## 2013-10-31 MED ORDER — METOCLOPRAMIDE HCL 5 MG/ML IJ SOLN
10.0000 mg | Freq: Once | INTRAMUSCULAR | Status: AC
  Administered 2013-10-31: 10 mg via INTRAVENOUS
  Filled 2013-10-31: qty 2

## 2013-10-31 MED ORDER — METHYLPREDNISOLONE (PAK) 4 MG PO TABS: ORAL_TABLET | ORAL | Status: DC

## 2013-10-31 MED ORDER — DIPHENHYDRAMINE HCL 50 MG/ML IJ SOLN
25.0000 mg | Freq: Once | INTRAMUSCULAR | Status: AC
  Administered 2013-10-31: 25 mg via INTRAVENOUS
  Filled 2013-10-31: qty 1

## 2013-10-31 MED ORDER — OXYCODONE-ACETAMINOPHEN 5-325 MG PO TABS: 2.0000 | ORAL_TABLET | ORAL | Status: DC | PRN

## 2013-10-31 MED ORDER — KETOROLAC TROMETHAMINE 30 MG/ML IJ SOLN
30.0000 mg | Freq: Once | INTRAMUSCULAR | Status: AC
  Administered 2013-10-31: 30 mg via INTRAVENOUS
  Filled 2013-10-31: qty 1

## 2013-10-31 MED ORDER — SODIUM CHLORIDE 0.9 % IV BOLUS (SEPSIS)
1000.0000 mL | Freq: Once | INTRAVENOUS | Status: AC
  Administered 2013-10-31: 1000 mL via INTRAVENOUS

## 2013-10-31 NOTE — Discharge Instructions (Signed)
Sciatica Sciatica is pain, weakness, numbness, or tingling along the path of the sciatic nerve. The nerve starts in the lower back and runs down the back of each leg. The nerve controls the muscles in the lower leg and in the back of the knee, while also providing sensation to the back of the thigh, lower leg, and the sole of your foot. Sciatica is a symptom of another medical condition. For instance, nerve damage or certain conditions, such as a herniated disk or bone spur on the spine, pinch or put pressure on the sciatic nerve. This causes the pain, weakness, or other sensations normally associated with sciatica. Generally, sciatica only affects one side of the body. CAUSES   Herniated or slipped disc.  Degenerative disk disease.  A pain disorder involving the narrow muscle in the buttocks (piriformis syndrome).  Pelvic injury or fracture.  Pregnancy.  Tumor (rare). SYMPTOMS  Symptoms can vary from mild to very severe. The symptoms usually travel from the low back to the buttocks and down the back of the leg. Symptoms can include:  Mild tingling or dull aches in the lower back, leg, or hip.  Numbness in the back of the calf or sole of the foot.  Burning sensations in the lower back, leg, or hip.  Sharp pains in the lower back, leg, or hip.  Leg weakness.  Severe back pain inhibiting movement. These symptoms may get worse with coughing, sneezing, laughing, or prolonged sitting or standing. Also, being overweight may worsen symptoms. DIAGNOSIS  Your caregiver will perform a physical exam to look for common symptoms of sciatica. He or she may ask you to do certain movements or activities that would trigger sciatic nerve pain. Other tests may be performed to find the cause of the sciatica. These may include:  Blood tests.  X-rays.  Imaging tests, such as an MRI or CT scan. TREATMENT  Treatment is directed at the cause of the sciatic pain. Sometimes, treatment is not necessary  and the pain and discomfort goes away on its own. If treatment is needed, your caregiver may suggest:  Over-the-counter medicines to relieve pain.  Prescription medicines, such as anti-inflammatory medicine, muscle relaxants, or narcotics.  Applying heat or ice to the painful area.  Steroid injections to lessen pain, irritation, and inflammation around the nerve.  Reducing activity during periods of pain.  Exercising and stretching to strengthen your abdomen and improve flexibility of your spine. Your caregiver may suggest losing weight if the extra weight makes the back pain worse.  Physical therapy.  Surgery to eliminate what is pressing or pinching the nerve, such as a bone spur or part of a herniated disk. HOME CARE INSTRUCTIONS   Only take over-the-counter or prescription medicines for pain or discomfort as directed by your caregiver.  Apply ice to the affected area for 20 minutes, 3-4 times a day for the first 48-72 hours. Then try heat in the same way.  Exercise, stretch, or perform your usual activities if these do not aggravate your pain.  Attend physical therapy sessions as directed by your caregiver.  Keep all follow-up appointments as directed by your caregiver.  Do not wear high heels or shoes that do not provide proper support.  Check your mattress to see if it is too soft. A firm mattress may lessen your pain and discomfort. SEEK IMMEDIATE MEDICAL CARE IF:   You lose control of your bowel or bladder (incontinence).  You have increasing weakness in the lower back, pelvis, buttocks,   or legs.  You have redness or swelling of your back.  You have a burning sensation when you urinate.  You have pain that gets worse when you lie down or awakens you at night.  Your pain is worse than you have experienced in the past.  Your pain is lasting longer than 4 weeks.  You are suddenly losing weight without reason. MAKE SURE YOU:  Understand these  instructions.  Will watch your condition.  Will get help right away if you are not doing well or get worse. Document Released: 02/17/2001 Document Revised: 08/25/2011 Document Reviewed: 07/05/2011 [employer] Patient Information 2015 [employer]. This information is not intended to replace advice given to you by your health care provider. Make sure you discuss any questions you have with your health care provider.  

## 2013-10-31 NOTE — ED Notes (Signed)
Pt able to ambulated without assistance

## 2013-10-31 NOTE — ED Notes (Signed)
Pt states she is feeling better and is ready to be discharged.

## 2013-10-31 NOTE — ED Notes (Signed)
Pt c/o back pain x 6 mo continuously and headache x 2 weeks continuously.  Back pain located lower back, described as cramping, throbbing and burning, occasional shooting pains down both legs.  Pain is continuous but intensity varies.  At times pt woken from sleep by back pain and headache.  Headache located at temples and across nose, described as throbbing and pressure.  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 Pt takes naproxen at home for pain with no relief.  Pt states she wants IV pain medication and symbicort for SOB after walking through the ED.  NAD, respirations mildly labored, skin warm and dry.
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
## 2013-10-31 NOTE — ED Notes (Signed)
Pt alert, arrives from home, c/o low back pain, headache, onset back pain chronic in nature, denies recent trauma or injury, headache onset was a week ago, speech clear, ambulates to room

## 2013-10-31 NOTE — ED Notes (Signed)
2 attempts made to start IV.

## 2013-11-10 ENCOUNTER — Emergency Department (HOSPITAL_COMMUNITY)
Admission: EM | Admit: 2013-11-10 | Discharge: 2013-11-11 | Disposition: A | Discharge status: Home / Self Care | Payer: Medicaid Other | Attending: Emergency Medicine | Admitting: Emergency Medicine

## 2013-11-10 DIAGNOSIS — I10 Essential (primary) hypertension: Secondary | ICD-10-CM | POA: Diagnosis not present

## 2013-11-10 DIAGNOSIS — F329 Major depressive disorder, single episode, unspecified: Secondary | ICD-10-CM | POA: Insufficient documentation

## 2013-11-10 DIAGNOSIS — Y9389 Activity, other specified: Secondary | ICD-10-CM | POA: Diagnosis not present

## 2013-11-10 DIAGNOSIS — F17200 Nicotine dependence, unspecified, uncomplicated: Secondary | ICD-10-CM | POA: Diagnosis not present

## 2013-11-10 DIAGNOSIS — S39012A Strain of muscle, fascia and tendon of lower back, initial encounter: Secondary | ICD-10-CM

## 2013-11-10 DIAGNOSIS — IMO0002 Reserved for concepts with insufficient information to code with codable children: Secondary | ICD-10-CM | POA: Diagnosis present

## 2013-11-10 DIAGNOSIS — I252 Old myocardial infarction: Secondary | ICD-10-CM | POA: Diagnosis not present

## 2013-11-10 DIAGNOSIS — I2510 Atherosclerotic heart disease of native coronary artery without angina pectoris: Secondary | ICD-10-CM | POA: Diagnosis not present

## 2013-11-10 DIAGNOSIS — F3289 Other specified depressive episodes: Secondary | ICD-10-CM | POA: Diagnosis not present

## 2013-11-10 DIAGNOSIS — S335XXA Sprain of ligaments of lumbar spine, initial encounter: Secondary | ICD-10-CM | POA: Insufficient documentation

## 2013-11-10 DIAGNOSIS — R296 Repeated falls: Secondary | ICD-10-CM | POA: Diagnosis not present

## 2013-11-10 DIAGNOSIS — Z79899 Other long term (current) drug therapy: Secondary | ICD-10-CM | POA: Insufficient documentation

## 2013-11-10 DIAGNOSIS — Y9289 Other specified places as the place of occurrence of the external cause: Secondary | ICD-10-CM | POA: Insufficient documentation

## 2013-11-11 ENCOUNTER — Emergency Department (HOSPITAL_COMMUNITY): Payer: Medicaid Other

## 2013-11-11 ENCOUNTER — Encounter (HOSPITAL_COMMUNITY): Payer: Self-pay | Admitting: Emergency Medicine

## 2013-11-11 MED ORDER — MORPHINE SULFATE 4 MG/ML IJ SOLN: 6.0000 mg | Freq: Once | INTRAMUSCULAR | Status: DC

## 2013-11-11 MED ORDER — TRAMADOL HCL 50 MG PO TABS
50.0000 mg | ORAL_TABLET | Freq: Once | ORAL | Status: AC
  Administered 2013-11-11: 50 mg via ORAL
  Filled 2013-11-11: qty 1

## 2013-11-11 MED ORDER — OXYCODONE-ACETAMINOPHEN 5-325 MG PO TABS
2.0000 | ORAL_TABLET | Freq: Once | ORAL | Status: AC
  Administered 2013-11-11: 2 via ORAL
  Filled 2013-11-11: qty 2

## 2013-11-11 MED ORDER — TRAMADOL HCL 50 MG PO TABS: 50.0000 mg | ORAL_TABLET | Freq: Four times a day (QID) | ORAL | Status: AC | PRN

## 2013-11-11 NOTE — Discharge Instructions (Signed)
If you were given medicines take as directed.  If you are on coumadin or contraceptives realize their levels and effectiveness is altered by many different medicines.  If you have any reaction (rash, tongues swelling, other) to the medicines stop taking and see a physician.   Please follow up as directed and return to the ER or see a physician for new or worsening symptoms.  Thank you. Filed Vitals:   11/11/13 0002  BP: 151/91  Pulse: 74  Temp: 98.2 F (36.8 C)  TempSrc: Oral  Resp: 18  SpO2: 98%
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
## 2013-11-11 NOTE — ED Notes (Signed)
Pt ambulated unassisted with steady gait to restroom
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
## 2013-11-11 NOTE — ED Notes (Signed)
Pt requesting rx for percocet. EDP denied this request. Also bus pass, there are no passes in the department. Pt made aware. Ambulatory to discharge, asking how to get to the cafeteria.

## 2013-11-21 ENCOUNTER — Other Ambulatory Visit: Payer: Self-pay | Admitting: Orthopedic Surgery

## 2013-11-21 DIAGNOSIS — M47816 Spondylosis without myelopathy or radiculopathy, lumbar region: Secondary | ICD-10-CM

## 2013-12-01 ENCOUNTER — Other Ambulatory Visit: Payer: Self-pay | Admitting: Orthopedic Surgery

## 2013-12-01 ENCOUNTER — Ambulatory Visit
Admission: RE | Admit: 2013-12-01 | Discharge: 2013-12-01 | Disposition: A | Discharge status: Home / Self Care | Payer: Medicaid Other | Source: Ambulatory Visit | Attending: Orthopedic Surgery | Admitting: Orthopedic Surgery

## 2013-12-01 DIAGNOSIS — M47816 Spondylosis without myelopathy or radiculopathy, lumbar region: Secondary | ICD-10-CM

## 2013-12-01 MED ORDER — IOHEXOL 180 MG/ML  SOLN
1.0000 mL | Freq: Once | INTRAMUSCULAR | Status: AC | PRN
  Administered 2013-12-01: 1 mL via EPIDURAL

## 2013-12-01 MED ORDER — METHYLPREDNISOLONE ACETATE 40 MG/ML INJ SUSP (RADIOLOG
120.0000 mg | Freq: Once | INTRAMUSCULAR | Status: AC
  Administered 2013-12-01: 120 mg via INTRA_ARTICULAR

## 2013-12-01 NOTE — Discharge Instructions (Signed)

## 2013-12-30 ENCOUNTER — Encounter (HOSPITAL_COMMUNITY): Payer: Self-pay | Admitting: Emergency Medicine

## 2013-12-30 ENCOUNTER — Emergency Department (HOSPITAL_COMMUNITY)
Admission: EM | Admit: 2013-12-30 | Discharge: 2014-01-07 | Disposition: E | Payer: Medicaid Other | Attending: Emergency Medicine | Admitting: Emergency Medicine

## 2013-12-30 DIAGNOSIS — I2510 Atherosclerotic heart disease of native coronary artery without angina pectoris: Secondary | ICD-10-CM | POA: Diagnosis not present

## 2013-12-30 DIAGNOSIS — Z79899 Other long term (current) drug therapy: Secondary | ICD-10-CM | POA: Insufficient documentation

## 2013-12-30 DIAGNOSIS — I469 Cardiac arrest, cause unspecified: Secondary | ICD-10-CM | POA: Insufficient documentation

## 2013-12-30 DIAGNOSIS — Z720 Tobacco use: Secondary | ICD-10-CM | POA: Diagnosis not present

## 2013-12-30 DIAGNOSIS — I10 Essential (primary) hypertension: Secondary | ICD-10-CM | POA: Insufficient documentation

## 2013-12-30 DIAGNOSIS — Z6841 Body Mass Index (BMI) 40.0 and over, adult: Secondary | ICD-10-CM | POA: Diagnosis not present

## 2013-12-30 DIAGNOSIS — I252 Old myocardial infarction: Secondary | ICD-10-CM | POA: Insufficient documentation

## 2013-12-30 HISTORY — DX: Heart failure, unspecified: I50.9
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
## 2014-01-07 NOTE — Code Documentation (Signed)
Organ procurement team notified. [name]  [phone]
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
## 2014-01-07 NOTE — ED Notes (Addendum)
~  38887: family heard pt. Choking and trouble breathing; they went up stairs and pt. Unresponsive. First responders arrived ~ [phone], and initiated CPR - pt. In asystole/pea; EMS arrived at [phone], and continued cpr thumper in place and working) Still in asystole/pea; EMS gave 5 amps of epinephrine per ACLS protocol.  Pt. Arrived via ems ~ 0730: Asystole/PEA, thumper in place, zoll pads in place, IO in place with kvo IV fluids, and [place]; EMS providing BVM.

## 2014-01-07 NOTE — Progress Notes (Signed)
Chaplain present at pt arrival. Chaplain notified front desk of her presence to page chaplain as soon as family arrives. Page chaplain if needed sooner [phone].  2014/01/05 0700  Clinical Encounter Type  Visited With Health care provider  Visit Type Death  Referral From Nurse  Rolly Salter, [place] 2014-01-05 7:52 AM
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
## 2014-01-07 DEATH — deceased

## 2014-01-08 ENCOUNTER — Encounter (HOSPITAL_COMMUNITY): Payer: Self-pay | Admitting: Emergency Medicine

## 2014-02-15 ENCOUNTER — Encounter (HOSPITAL_COMMUNITY): Payer: Self-pay | Admitting: Cardiology

## 2016-02-09 IMAGING — CT CT ABD-PELV W/ CM
1 of 3 series · 14 of 32 positions shown, 19 images · IV contrast (OMNIPAQUE 300)
Comparison: 01/12/2013

CLINICAL DATA: Abdominal pain

EXAM:
CT ABDOMEN AND PELVIS WITH CONTRAST
TECHNIQUE: Multidetector CT imaging of the abdomen and pelvis was performed
using the standard protocol following bolus administration of
intravenous contrast.
CONTRAST:  100mL OMNIPAQUE IOHEXOL 300 MG/ML  SOLN

[Series 2: abd/pel with · axial · 0.74mm/px · z∈[-184,+196]mm · 14 of 86 slices shown, 19 images]
[im 5/86  soft-tissue]
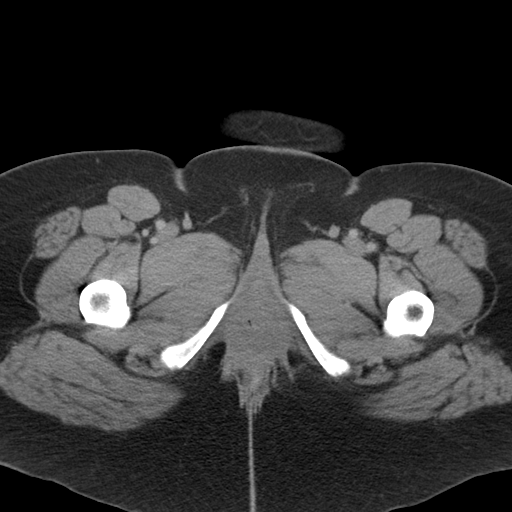
[im 5/86  bone]
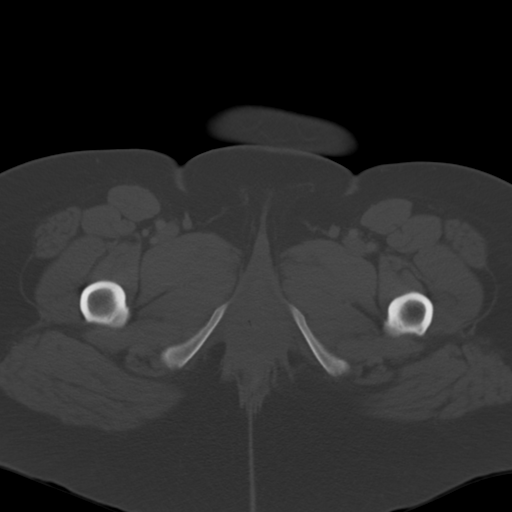
[im 14/86  soft-tissue]
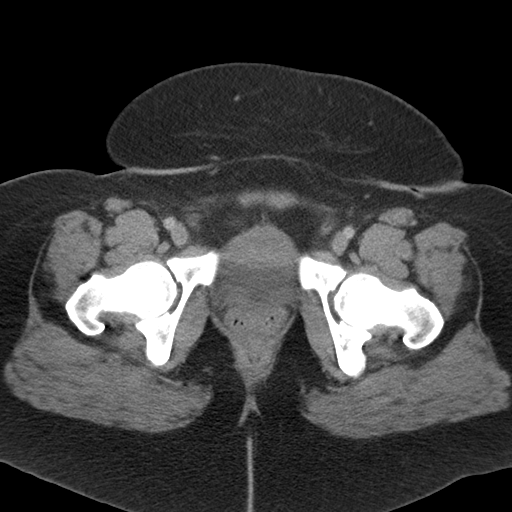
[im 18/86  soft-tissue]
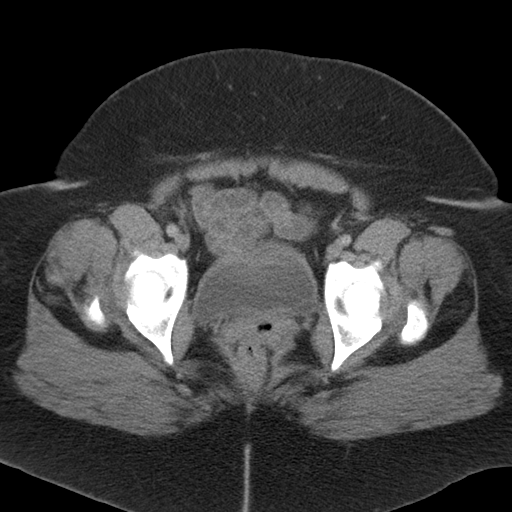
[im 23/86  soft-tissue]
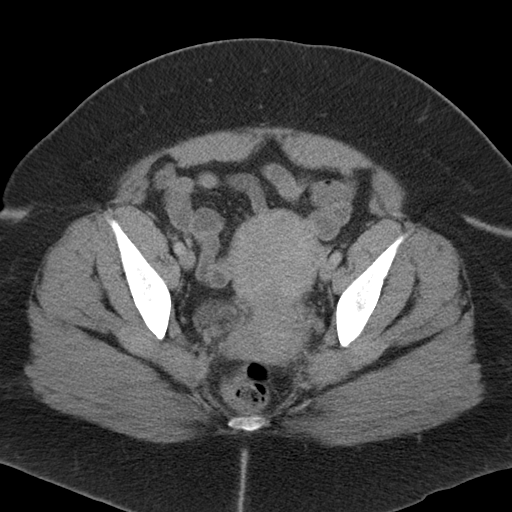
[im 32/86  soft-tissue]
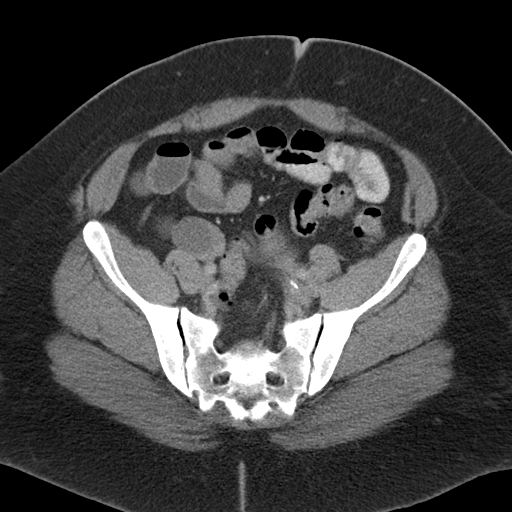
[im 36/86  soft-tissue]
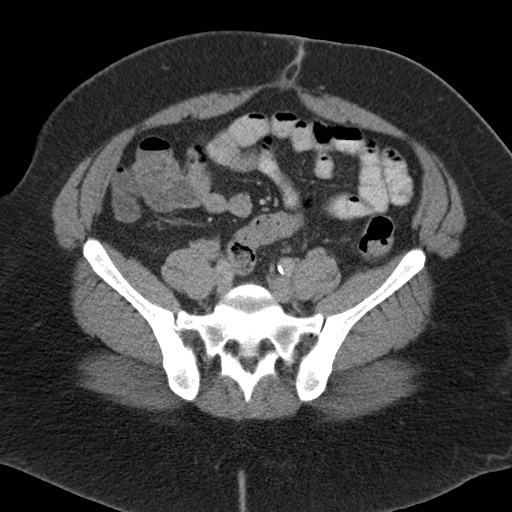
[im 45/86  soft-tissue]
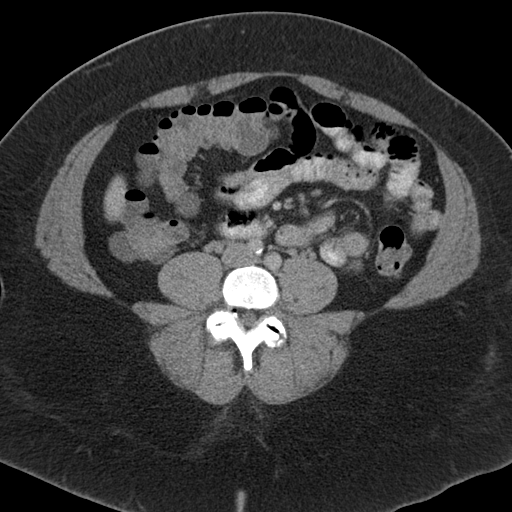
[im 50/86  soft-tissue]
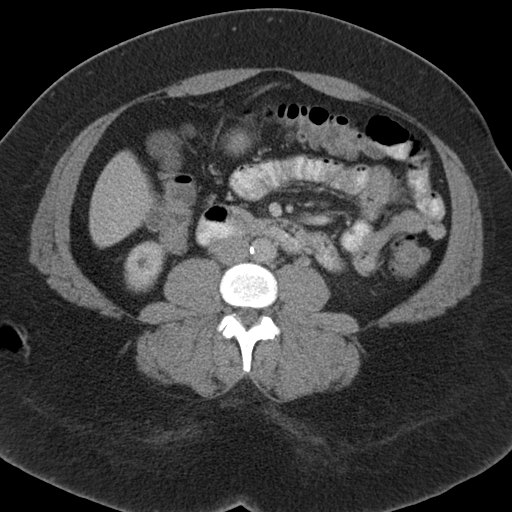
[im 54/86  soft-tissue]
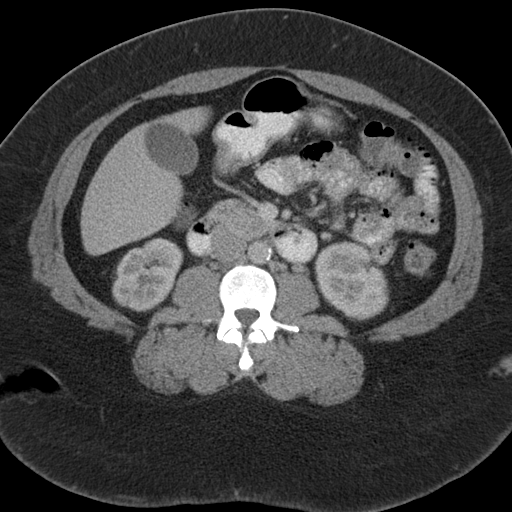
[im 54/86  bone]
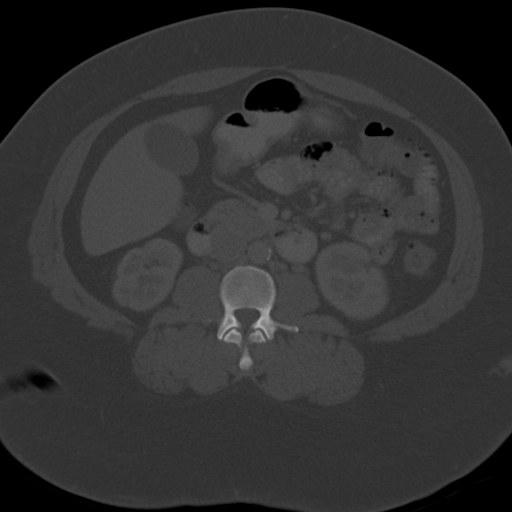
[im 63/86  soft-tissue]
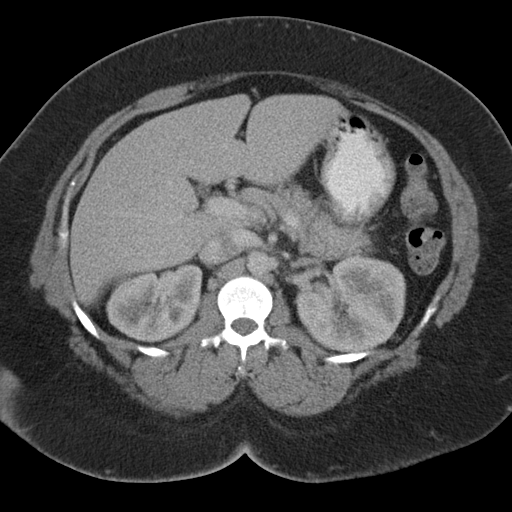
[im 68/86  soft-tissue]
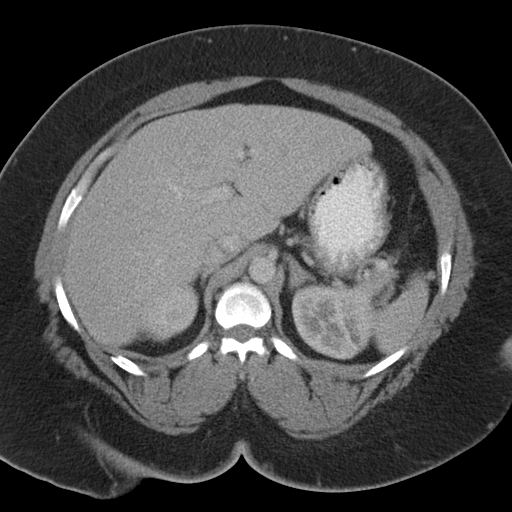
[im 68/86  lung]
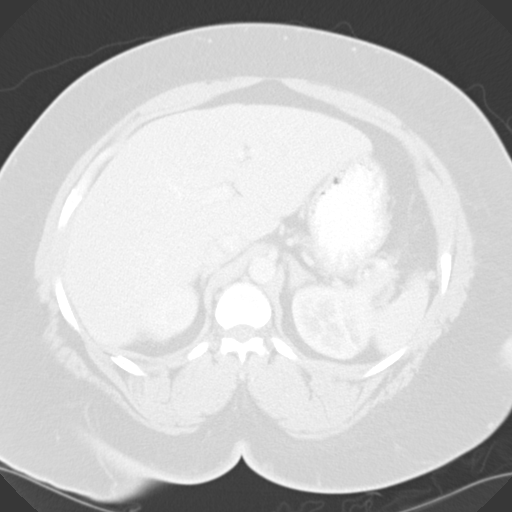
[im 72/86  soft-tissue]
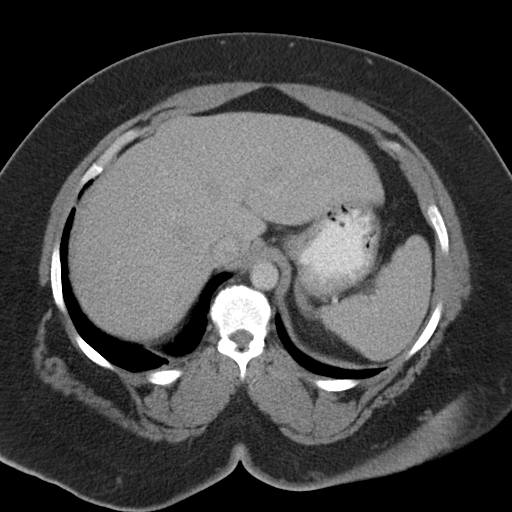
[im 72/86  lung]
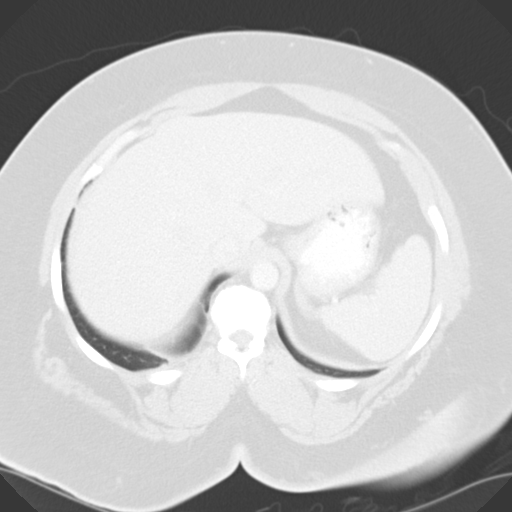
[im 77/86  lung]
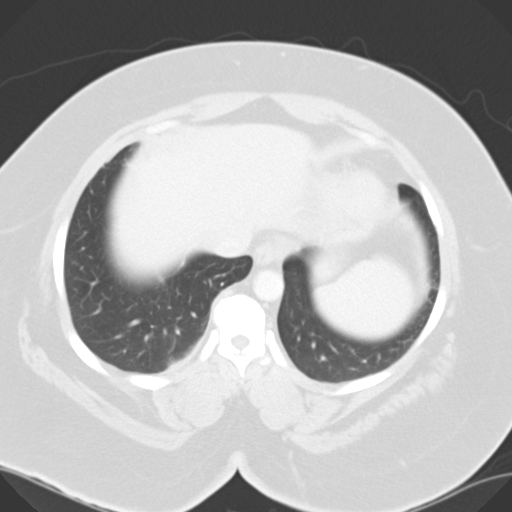
[im 81/86  soft-tissue]
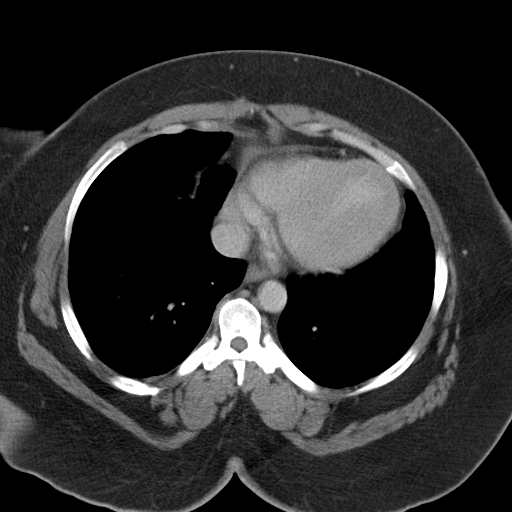
[im 81/86  lung]
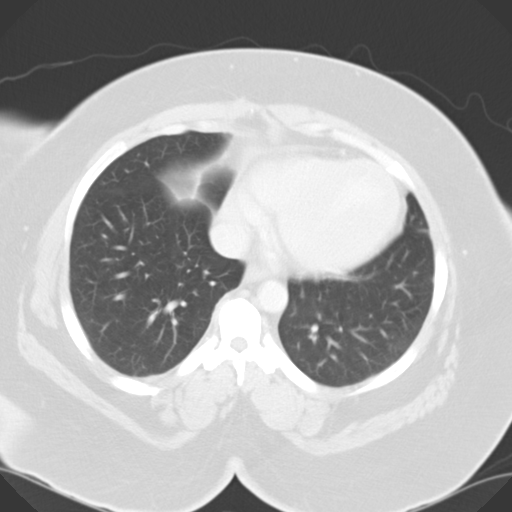

[14 of 32 positions shown; findings below may reference images not displayed]

FINDINGS: Lung bases are free of acute infiltrate. Mild atelectasis is noted
on the right.

The liver, gallbladder, spleen, adrenal glands And pancreas are
within normal limits. The right kidney shows a hypodensity without
significant enhancement which measures 3.3 cm. This is consistent
with a simple cyst. Similar lesions are noted in both kidneys. The
largest on the left measures 20 mm. No renal calculi or urinary
tract obstructive changes are seen. Normal excretion of contrast
material is noted.

The appendix is air-filled and within normal limits. The bladder is
partially distended. No pelvic mass lesion is seen. The uterus shows
changes of uterine fibroids similar to that seen on prior
ultrasound. No free fluid is noted. Degenerative change of the
lumbar spine is seen.
IMPRESSION: Bilateral renal cysts.

Uterine fibroids.

No acute abnormality noted.

## 2016-02-24 ENCOUNTER — Telehealth: Payer: Self-pay | Admitting: *Deleted

## 2016-02-24 NOTE — Telephone Encounter (Signed)
Opened phone note on wrong pt.. Same name.
